# Patient Record
Sex: Male | Born: 1950
Health system: Southern US, Community
[De-identification: ages and names within clinical notes are randomized; demographics above are authoritative.]

## PROBLEM LIST (undated history)

## (undated) DIAGNOSIS — E785 Hyperlipidemia, unspecified: Secondary | ICD-10-CM

## (undated) DIAGNOSIS — E119 Type 2 diabetes mellitus without complications: Secondary | ICD-10-CM

## (undated) DIAGNOSIS — I10 Essential (primary) hypertension: Secondary | ICD-10-CM

## (undated) DIAGNOSIS — K635 Polyp of colon: Secondary | ICD-10-CM

## (undated) DIAGNOSIS — R011 Cardiac murmur, unspecified: Secondary | ICD-10-CM

## (undated) HISTORY — DX: Type 2 diabetes mellitus without complications: E11.9

## (undated) HISTORY — DX: Hyperlipidemia, unspecified: E78.5

## (undated) HISTORY — DX: Polyp of colon: K63.5

## (undated) HISTORY — DX: Cardiac murmur, unspecified: R01.1

## (undated) HISTORY — DX: Essential (primary) hypertension: I10

---

## 1990-09-24 HISTORY — PX: OTHER SURGICAL HISTORY: SHX169

## 2018-07-02 ENCOUNTER — Telehealth: Payer: Self-pay | Admitting: Family Medicine

## 2018-07-02 ENCOUNTER — Ambulatory Visit (INDEPENDENT_AMBULATORY_CARE_PROVIDER_SITE_OTHER): Payer: PPO | Admitting: Family Medicine

## 2018-07-02 ENCOUNTER — Encounter: Payer: Self-pay | Admitting: Family Medicine

## 2018-07-02 VITALS — BP 138/88 | HR 49 | Temp 98.1°F | Ht 64.0 in | Wt 154.1 lb

## 2018-07-02 DIAGNOSIS — E119 Type 2 diabetes mellitus without complications: Secondary | ICD-10-CM | POA: Diagnosis not present

## 2018-07-02 DIAGNOSIS — Z23 Encounter for immunization: Secondary | ICD-10-CM

## 2018-07-02 DIAGNOSIS — I10 Essential (primary) hypertension: Secondary | ICD-10-CM

## 2018-07-02 LAB — HEMOGLOBIN A1C: HEMOGLOBIN A1C: 6.5 % (ref 4.6–6.5)

## 2018-07-02 MED ORDER — AMLODIPINE BESYLATE-VALSARTAN 5-320 MG PO TABS: 1.0000 | ORAL_TABLET | Freq: Every day | ORAL | 5 refills | Status: DC

## 2018-07-02 MED ORDER — METOPROLOL SUCCINATE ER 100 MG PO TB24: 100.0000 mg | ORAL_TABLET | Freq: Every day | ORAL | 3 refills | Status: DC

## 2018-07-02 NOTE — Telephone Encounter (Signed)
Pt given results per notes of Dr. Carmelia Roller on 07/02/18, patient verbalized understanding. Unable to document in result note due to result note not being routed to [facility].

## 2018-07-02 NOTE — Progress Notes (Signed)
Chief Complaint  Patient presents with  . New Patient (Initial Visit)       New Patient Visit SUBJECTIVE: HPI: Scott Davila is an 67 y.o.male who is being seen for establishing care.   The patient was previously seen in [place].  Hypertension Patient presents for hypertension follow up. He does not monitor home blood pressures. He is compliant with medications- Metoprolol 200 mg/d, HCTZ 25 mg/d, Quinapril 40 mg/d. Patient has these side effects of medication: fatigue He is adhering to a good diet overall. Exercise: nothing   History of diabetes, last A1c was 6.1 around 6 months ago.  He is compliant with his metformin 500 mg twice daily.  He denies any chest pain or shortness of breath.  He is taking simvastatin.  Diet and exercise as noted above.  His last eye exam was over a year ago but with the recent move he wanted to get established down here.  Allergies  Allergen Reactions  . Penicillins     Past Medical History:  Diagnosis Date  . Colon polyps   . Diabetes mellitus without complication (HCC)   . Heart murmur   . Hyperlipidemia   . Hypertension    History reviewed. No pertinent surgical history. Family History  Problem Relation Age of Onset  . Arthritis Mother   . Hearing loss Mother   . Hyperlipidemia Mother   . Hypertension Mother   . Dementia Mother   . Dementia Father   . Muscular dystrophy Father   . COPD Sister   . Alcohol abuse Brother   . COPD Maternal Grandmother   . Hearing loss Maternal Grandmother   . Diabetes Maternal Grandfather   . Heart disease Paternal Grandfather   . Heart attack Paternal Grandfather    Allergies  Allergen Reactions  . Penicillins     Current Outpatient Medications:  .  aspirin EC 81 MG tablet, Take 81 mg by mouth daily., Disp: , Rfl:  .  B Complex Vitamins (B COMPLEX 1 PO), Take 1 tablet by mouth daily., Disp: , Rfl:  .  cholecalciferol (VITAMIN D) 400 units TABS tablet, Take 400 Units by mouth daily., Disp: , Rfl:   .  co-enzyme Q-10 30 MG capsule, Take 30 mg by mouth 3 (three) times daily., Disp: , Rfl:  .  Cobalamine Combinations (B-12) 1000-400 MCG SUBL, Place 1 tablet under the tongue daily., Disp: , Rfl:  .  hydrochlorothiazide (HYDRODIURIL) 25 MG tablet, Take 25 mg by mouth daily., Disp: , Rfl:  .  Lycopene 10 MG CAPS, Take 1 capsule by mouth daily., Disp: , Rfl:  .  Magnesium 250 MG TABS, Take 1 tablet by mouth daily., Disp: , Rfl:  .  metFORMIN (GLUCOPHAGE) 500 MG tablet, Take by mouth 2 (two) times daily with a meal., Disp: , Rfl:  .  Multiple Vitamins-Minerals (SENIOR MULTIVITAMIN PLUS PO), Take by mouth., Disp: , Rfl:  .  Omega-3 Fatty Acids (FISH OIL) 1000 MG CAPS, Take 1 capsule by mouth daily., Disp: , Rfl:  .  simvastatin (ZOCOR) 20 MG tablet, Take 20 mg by mouth daily., Disp: , Rfl:  .  vitamin C (ASCORBIC ACID) 500 MG tablet, Take 500 mg by mouth daily., Disp: , Rfl:  .  amLODipine-valsartan (EXFORGE) 5-320 MG tablet, Take 1 tablet by mouth daily., Disp: 30 tablet, Rfl: 5 .  metoprolol succinate (TOPROL-XL) 100 MG 24 hr tablet, Take 1 tablet (100 mg total) by mouth daily. Take with or immediately following a meal., Disp: 90  tablet, Rfl: 3  ROS Cardiovascular: Denies chest pain  Respiratory: Denies dyspnea   OBJECTIVE: BP 138/88 (BP Location: Left Arm, Patient Position: Sitting, Cuff Size: Normal)   Pulse (!) 49   Temp 98.1 F (36.7 C) (Oral)   Ht 5\' 4"  (1.626 m)   Wt 154 lb 2 oz (69.9 kg)   SpO2 95%   BMI 26.46 kg/m   Constitutional: -  VS reviewed -  Well developed, well nourished, appears stated age -  No apparent distress  Psychiatric: -  Oriented to person, place, and time -  Memory intact -  Affect and mood normal -  Fluent conversation, good eye contact -  Judgment and insight age appropriate  Eye: -  Conjunctivae clear, no discharge -  Pupils symmetric, round, reactive to light  ENMT: -  MMM    Pharynx moist, no exudate, no erythema  Neck: -  No gross swelling,  no palpable masses -  Thyroid midline, not enlarged, mobile, no palpable masses  Cardiovascular: -  RRR -  No LE edema  Respiratory: -  Normal respiratory effort, no accessory muscle use, no retraction -  Breath sounds equal, no wheezes, no ronchi, no crackles  Musculoskeletal: -  No clubbing, no cyanosis -  Gait normal  Skin: -  No significant lesion on inspection -  Warm and dry to palpation   ASSESSMENT/PLAN: Diabetes mellitus type 2 in nonobese (HCC) - Plan: metFORMIN (GLUCOPHAGE) 500 MG tablet, simvastatin (ZOCOR) 20 MG tablet, Hemoglobin A1c, Ambulatory referral to Ophthalmology  Essential hypertension - Plan: hydrochlorothiazide (HYDRODIURIL) 25 MG tablet, metoprolol succinate (TOPROL-XL) 100 MG 24 hr tablet, amLODipine-valsartan (EXFORGE) 5-320 MG tablet  Need for influenza vaccination - Plan: Flu vaccine HIGH DOSE PF (Fluzone High dose)  Patient instructed to sign release of records form from his previous PCP. Counseled on diet and exercise. Ck BP's at home. Patient should return in 6 weeks to reck BP. The patient voiced understanding and agreement to the plan.   [name] [place], DO 07/02/18  10:39 AM

## 2018-07-02 NOTE — Progress Notes (Signed)
Pre visit review using our clinic review tool, if applicable. No additional management support is needed unless otherwise documented below in the visit note. 

## 2018-07-02 NOTE — Telephone Encounter (Signed)
Copied from CRM [id number]. Topic: Quick Communication - Lab Results >> Jul 02, 2018  4:44 PM Ewing, Vladimir Crofts, CMA wrote: Called patient to inform them of 07/02/2018 lab results. When patient returns call, triage nurse may disclose results. >> Jul 02, 2018  4:59 PM Darletta Moll L wrote: No [facility] nurse available for lab results. Please call patient.

## 2018-07-02 NOTE — Patient Instructions (Signed)
Around 3 times per week, check your blood pressure 4 times per day. Twice in the morning and twice in the evening. The readings should be at least one minute apart. Write down these values and bring them to your next nurse visit/appointment.  When you check your BP, make sure you have been doing something calm/relaxing 5 minutes prior to checking. Both feet should be flat on the floor and you should be sitting. Use your left arm and make sure it is in a relaxed position (on a table), and that the cuff is at the approximate level/height of your heart.   Keep the diet clean and stay active.  Give [place] 2-3 business days to get the results of your labs back.   If you do not hear anything about your referral in the next 1-2 weeks, call our office and ask for an update.  Let us know if you need anything.

## 2018-07-23 DIAGNOSIS — E119 Type 2 diabetes mellitus without complications: Secondary | ICD-10-CM | POA: Diagnosis not present

## 2018-07-23 DIAGNOSIS — H40023 Open angle with borderline findings, high risk, bilateral: Secondary | ICD-10-CM | POA: Diagnosis not present

## 2018-07-23 LAB — HM DIABETES EYE EXAM

## 2018-07-25 ENCOUNTER — Encounter: Payer: Self-pay | Admitting: Family Medicine

## 2018-07-25 ENCOUNTER — Telehealth: Payer: Self-pay | Admitting: *Deleted

## 2018-07-25 NOTE — Telephone Encounter (Signed)
Received Diabetic Eye Exam Report from [facility]; forwarded to provider/SLS 11/01

## 2018-07-28 ENCOUNTER — Telehealth: Payer: Self-pay | Admitting: *Deleted

## 2018-07-28 NOTE — Telephone Encounter (Signed)
Received Medical records from [facility]; forwarded to provider/SLS 11/04

## 2018-07-30 ENCOUNTER — Telehealth: Payer: Self-pay | Admitting: Family Medicine

## 2018-07-30 NOTE — Telephone Encounter (Signed)
Pt due for colonoscopy in 12/19/19. Has never had AAA, Hep C screen or Shingrix?

## 2018-08-13 ENCOUNTER — Encounter: Payer: Self-pay | Admitting: Family Medicine

## 2018-08-13 ENCOUNTER — Ambulatory Visit (INDEPENDENT_AMBULATORY_CARE_PROVIDER_SITE_OTHER): Payer: PPO | Admitting: Family Medicine

## 2018-08-13 VITALS — BP 120/84 | HR 69 | Temp 97.4°F | Ht 63.5 in | Wt 161.0 lb

## 2018-08-13 DIAGNOSIS — E663 Overweight: Secondary | ICD-10-CM

## 2018-08-13 DIAGNOSIS — I10 Essential (primary) hypertension: Secondary | ICD-10-CM | POA: Diagnosis not present

## 2018-08-13 DIAGNOSIS — E119 Type 2 diabetes mellitus without complications: Secondary | ICD-10-CM | POA: Diagnosis not present

## 2018-08-13 MED ORDER — METOPROLOL SUCCINATE ER 50 MG PO TB24: 50.0000 mg | ORAL_TABLET | Freq: Every day | ORAL | 1 refills | Status: DC

## 2018-08-13 MED ORDER — SIMVASTATIN 20 MG PO TABS: 20.0000 mg | ORAL_TABLET | Freq: Every day | ORAL | 2 refills | Status: DC

## 2018-08-13 MED ORDER — HYDROCHLOROTHIAZIDE 25 MG PO TABS: 25.0000 mg | ORAL_TABLET | Freq: Every day | ORAL | 2 refills | Status: DC

## 2018-08-13 NOTE — Progress Notes (Signed)
Chief Complaint  Patient presents with  . Follow-up    blood pressure    Subjective Scott HartshornWilfred Davila is a 67 y.o. male who presents for hypertension follow up. He does monitor home blood pressures. Blood pressures ranging from 110-130's/70's on average. He is compliant with medications. Patient has these side effects of medication: none He is adhering to a healthy diet overall. Current exercise: none   Past Medical History:  Diagnosis Date  . Colon polyps   . Diabetes mellitus without complication (HCC)   . Heart murmur   . Hyperlipidemia   . Hypertension     Review of Systems Cardiovascular: no chest pain Respiratory:  no shortness of breath  Exam BP 120/84 (BP Location: Left Arm, Patient Position: Sitting, Cuff Size: Normal)   Pulse 69   Temp (!) 97.4 F (36.3 C) (Oral)   Ht 5' 3.5" (1.613 m)   Wt 161 lb (73 kg)   SpO2 95%   BMI 28.07 kg/m  General:  well developed, well nourished, in no apparent distress Heart: RRR, no bruits, no LE edema Lungs: clear to auscultation, no accessory muscle use Psych: well oriented with normal range of affect and appropriate judgment/insight  Essential hypertension - Plan: hydrochlorothiazide (HYDRODIURIL) 25 MG tablet  Overweight  Diabetes mellitus type 2 in nonobese (HCC) - Plan: simvastatin (ZOCOR) 20 MG tablet  Cont BP meds. Ck at home.  Counseled on diet and exercise. Goal wt for 5 mo f/u is 150 lbs. F/u in 5 mo or prn. The patient voiced understanding and agreement to the plan.  [name], DO 08/13/18  2:24 PM

## 2018-08-13 NOTE — Patient Instructions (Addendum)
Ok to stop checking BP so often. Consider a new cuff. When checking, OK to do once per day. Write down levels.  Keep the diet clean and stay active.  Aim to do some physical exertion for 150 minutes per week. This is typically divided into 5 days per week, 30 minutes per day. The activity should be enough to get your heart rate up. Anything is better than nothing if you have time constraints.  Goal weight: 150 lbs by April.  Let us know if you need anything.

## 2018-08-13 NOTE — Progress Notes (Signed)
Pre visit review using our clinic review tool, if applicable. No additional management support is needed unless otherwise documented below in the visit note. 

## 2018-09-03 DIAGNOSIS — H40023 Open angle with borderline findings, high risk, bilateral: Secondary | ICD-10-CM | POA: Diagnosis not present

## 2018-09-03 DIAGNOSIS — E119 Type 2 diabetes mellitus without complications: Secondary | ICD-10-CM | POA: Diagnosis not present

## 2018-10-06 ENCOUNTER — Other Ambulatory Visit: Payer: Self-pay

## 2018-10-06 ENCOUNTER — Emergency Department (HOSPITAL_BASED_OUTPATIENT_CLINIC_OR_DEPARTMENT_OTHER): Payer: PPO

## 2018-10-06 ENCOUNTER — Encounter (HOSPITAL_BASED_OUTPATIENT_CLINIC_OR_DEPARTMENT_OTHER): Payer: Self-pay | Admitting: Emergency Medicine

## 2018-10-06 ENCOUNTER — Emergency Department (HOSPITAL_BASED_OUTPATIENT_CLINIC_OR_DEPARTMENT_OTHER)
Admission: EM | Admit: 2018-10-06 | Discharge: 2018-10-06 | Disposition: A | Discharge status: Home / Self Care | Payer: PPO | Attending: Emergency Medicine | Admitting: Emergency Medicine

## 2018-10-06 DIAGNOSIS — Z87891 Personal history of nicotine dependence: Secondary | ICD-10-CM | POA: Insufficient documentation

## 2018-10-06 DIAGNOSIS — Z7982 Long term (current) use of aspirin: Secondary | ICD-10-CM | POA: Insufficient documentation

## 2018-10-06 DIAGNOSIS — I10 Essential (primary) hypertension: Secondary | ICD-10-CM | POA: Insufficient documentation

## 2018-10-06 DIAGNOSIS — Z794 Long term (current) use of insulin: Secondary | ICD-10-CM | POA: Insufficient documentation

## 2018-10-06 DIAGNOSIS — E119 Type 2 diabetes mellitus without complications: Secondary | ICD-10-CM | POA: Diagnosis not present

## 2018-10-06 DIAGNOSIS — Z79899 Other long term (current) drug therapy: Secondary | ICD-10-CM | POA: Insufficient documentation

## 2018-10-06 DIAGNOSIS — R0789 Other chest pain: Secondary | ICD-10-CM

## 2018-10-06 DIAGNOSIS — R079 Chest pain, unspecified: Secondary | ICD-10-CM | POA: Diagnosis not present

## 2018-10-06 LAB — CBC WITH DIFFERENTIAL/PLATELET
Abs Immature Granulocytes: 0.02 10*3/uL (ref 0.00–0.07)
Basophils Absolute: 0.1 10*3/uL (ref 0.0–0.1)
Basophils Relative: 1 %
Eosinophils Absolute: 0.1 10*3/uL (ref 0.0–0.5)
Eosinophils Relative: 2 %
HCT: 46.9 % (ref 39.0–52.0)
Hemoglobin: 15.5 g/dL (ref 13.0–17.0)
Immature Granulocytes: 0 %
LYMPHS PCT: 28 %
Lymphs Abs: 1.8 10*3/uL (ref 0.7–4.0)
MCH: 29.7 pg (ref 26.0–34.0)
MCHC: 33 g/dL (ref 30.0–36.0)
MCV: 89.8 fL (ref 80.0–100.0)
Monocytes Absolute: 0.7 10*3/uL (ref 0.1–1.0)
Monocytes Relative: 11 %
NEUTROS ABS: 3.7 10*3/uL (ref 1.7–7.7)
Neutrophils Relative %: 58 %
Platelets: 187 10*3/uL (ref 150–400)
RBC: 5.22 MIL/uL (ref 4.22–5.81)
RDW: 12.3 % (ref 11.5–15.5)
WBC: 6.3 10*3/uL (ref 4.0–10.5)
nRBC: 0 % (ref 0.0–0.2)

## 2018-10-06 LAB — COMPREHENSIVE METABOLIC PANEL
ALT: 54 U/L — ABNORMAL HIGH (ref 0–44)
AST: 31 U/L (ref 15–41)
Albumin: 4.5 g/dL (ref 3.5–5.0)
Alkaline Phosphatase: 52 U/L (ref 38–126)
Anion gap: 8 (ref 5–15)
BUN: 18 mg/dL (ref 8–23)
CO2: 25 mmol/L (ref 22–32)
Calcium: 9.6 mg/dL (ref 8.9–10.3)
Chloride: 103 mmol/L (ref 98–111)
Creatinine, Ser: 0.88 mg/dL (ref 0.61–1.24)
GFR calc Af Amer: >60 mL/min (ref >60)
GFR calc non Af Amer: >60 mL/min (ref >60)
Glucose, Bld: 187 mg/dL — ABNORMAL HIGH (ref 70–99)
Potassium: 3.8 mmol/L (ref 3.5–5.1)
SODIUM: 136 mmol/L (ref 135–145)
Total Bilirubin: 0.9 mg/dL (ref 0.3–1.2)
Total Protein: 7.3 g/dL (ref 6.5–8.1)

## 2018-10-06 LAB — LIPASE, BLOOD: Lipase: 36 U/L (ref 11–51)

## 2018-10-06 LAB — TROPONIN I
Troponin I: <0.03 ng/mL (ref <0.03)
Troponin I: <0.03 ng/mL (ref <0.03)

## 2018-10-06 NOTE — Discharge Instructions (Signed)
Take pepcid as needed for chest pain. You likely have some reflux.   See your doctor.  Return to ER if you have worse chest pain, trouble breathing.

## 2018-10-06 NOTE — ED Notes (Signed)
Pt given water 

## 2018-10-06 NOTE — ED Provider Notes (Signed)
[facility] EMERGENCY DEPARTMENT Provider Note   CSN: [id number] Arrival date & time: 10/06/18  1111     History   Chief Complaint Chief Complaint  Patient presents with  . Chest Pain    HPI Scott Davila is a 68 y.o. male history of diabetes, heart murmur, hyperlipidemia, hypertension here presenting with right-sided chest pain.  Patient states that he has intermittent right-sided chest pain for the last several days.  States that it is intermittent and there is no certain pattern.  Patient states that it is not exertional and is not worse with eating.  He was driving today and the pain got slightly worse so he came in for evaluation.  He was noted to be hypertensive in triage but took his blood pressure medicine this morning.  He has no known coronary artery disease.  Denies any recent travel.   The history is provided by the patient.    Past Medical History:  Diagnosis Date  . Colon polyps   . Diabetes mellitus without complication (HCC)   . Heart murmur   . Hyperlipidemia   . Hypertension     Patient Active Problem List   Diagnosis Date Noted  . Essential hypertension 08/13/2018  . Diabetes mellitus type 2 in nonobese (HCC) 07/02/2018    History reviewed. No pertinent surgical history.      Home Medications    Prior to Admission medications   Medication Sig Start Date End Date Taking? Authorizing Provider  amLODipine-valsartan (EXFORGE) 5-320 MG tablet Take 1 tablet by mouth daily. 07/02/18   Sharlene Dory, DO  aspirin EC 81 MG tablet Take 81 mg by mouth daily.    [provider]  B Complex Vitamins (B COMPLEX 1 PO) Take 1 tablet by mouth daily.    [provider]  cholecalciferol (VITAMIN D) 400 units TABS tablet Take 400 Units by mouth daily.    [provider]  co-enzyme Q-10 30 MG capsule Take 30 mg by mouth 3 (three) times daily.    [provider]  Cobalamine Combinations (B-12) 1000-400 MCG SUBL  Place 1 tablet under the tongue daily.    [provider]  hydrochlorothiazide (HYDRODIURIL) 25 MG tablet Take 1 tablet (25 mg total) by mouth daily. 08/13/18   Sharlene Dory, DO  Lycopene 10 MG CAPS Take 1 capsule by mouth daily.    [provider]  Magnesium 250 MG TABS Take 1 tablet by mouth daily.    [provider]  metFORMIN (GLUCOPHAGE) 500 MG tablet Take by mouth 2 (two) times daily with a meal.    [provider]  metoprolol succinate (TOPROL-XL) 50 MG 24 hr tablet Take 1 tablet (50 mg total) by mouth daily. Take with or immediately following a meal. 08/13/18   Wendling, [name], DO  Multiple Vitamins-Minerals (SENIOR MULTIVITAMIN PLUS PO) Take by mouth.    [provider]  Omega-3 Fatty Acids (FISH OIL) 1000 MG CAPS Take 1 capsule by mouth daily.    [provider]  simvastatin (ZOCOR) 20 MG tablet Take 1 tablet (20 mg total) by mouth daily. 08/13/18   Sharlene Dory, DO  vitamin C (ASCORBIC ACID) 500 MG tablet Take 500 mg by mouth daily.    [provider]    Family History Family History  Problem Relation Age of Onset  . Arthritis Mother   . Hearing loss Mother   . Hyperlipidemia Mother   . Hypertension Mother   . Dementia Mother   .  Dementia Father   . Muscular dystrophy Father   . COPD Sister   . Alcohol abuse Brother   . COPD Maternal Grandmother   . Hearing loss Maternal Grandmother   . Diabetes Maternal Grandfather   . Heart disease Paternal Grandfather   . Heart attack Paternal Grandfather     Social History Social History   Tobacco Use  . Smoking status: Former Games developermoker  . Smokeless tobacco: Never Used  Substance Use Topics  . Alcohol use: Never    Frequency: Never  . Drug use: Never     Allergies   Penicillins   Review of Systems Review of Systems  Cardiovascular: Positive for chest pain.  All other systems reviewed and are negative.    Physical  Exam Updated Vital Signs BP (!) 128/92 (BP Location: Left Arm)   Pulse 70   Temp 97.9 F (36.6 C) (Oral)   Resp 15   Ht 5' 3.5" (1.613 m)   Wt 73.5 kg   SpO2 95%   BMI 28.25 kg/m   Physical Exam Vitals signs and nursing note reviewed.  Constitutional:      Appearance: He is well-developed.  HENT:     Head: Normocephalic.  Eyes:     Extraocular Movements: Extraocular movements intact.     Pupils: Pupils are equal, round, and reactive to light.  Neck:     Musculoskeletal: Normal range of motion and neck supple.  Cardiovascular:     Rate and Rhythm: Normal rate and regular rhythm.     Heart sounds: Normal heart sounds.  Pulmonary:     Effort: Pulmonary effort is normal.     Breath sounds: Normal breath sounds.  Abdominal:     General: Bowel sounds are normal.     Palpations: Abdomen is soft.  Musculoskeletal: Normal range of motion.  Skin:    General: Skin is warm.     Capillary Refill: Capillary refill takes less than 2 seconds.  Neurological:     General: No focal deficit present.     Mental Status: He is alert and oriented to person, place, and time.  Psychiatric:        Mood and Affect: Mood normal.        Behavior: Behavior normal.      ED Treatments / Results  Labs (all labs ordered are listed, but only abnormal results are displayed) Labs Reviewed  COMPREHENSIVE METABOLIC PANEL - Abnormal; Notable for the following components:      Result Value   Glucose, Bld 187 (*)    ALT 54 (*)    All other components within normal limits  CBC WITH DIFFERENTIAL/PLATELET  TROPONIN I  LIPASE, BLOOD  TROPONIN I    EKG EKG Interpretation  Date/Time:  Monday October 06 2018 11:22:04 EST Ventricular Rate:  73 PR Interval:  182 QRS Duration: 84 QT Interval:  420 QTC Calculation: 462 R Axis:   6 Text Interpretation:  Normal sinus rhythm Septal infarct , age undetermined Abnormal ECG No previous ECGs available Confirmed by Richardean CanalYao, Wilian Kwong H ([id number]) on 10/06/2018  11:49:59 AM   Radiology Dg Chest 2 View  Result Date: 10/06/2018 CLINICAL DATA:  Chest pain. EXAM: CHEST - 2 VIEW COMPARISON:  None. FINDINGS: Heart size and pulmonary vascularity are normal. Lungs are clear. Aortic atherosclerosis. No acute bone abnormality. Old fracture of the posterolateral aspect of the left ninth rib. IMPRESSION: No active cardiopulmonary disease. Aortic Atherosclerosis (ICD10-I70.0). Electronically Signed   By: Francene BoyersJames  Maxwell M.D.   On:  10/06/2018 12:06    Procedures Procedures (including critical care time)  Medications Ordered in ED Medications - No data to display   Initial Impression / Assessment and Plan / ED Course  I have reviewed the triage vital signs and the nursing notes.  Pertinent labs & imaging results that were available during my care of the patient were reviewed by me and considered in my medical decision making (see chart for details).    Scott Davila is a 68 y.o. male here with chest pain. R sided chest pain for several days. No previous hx of CAD. Patient hypertensive but not tachycardic. I doubt PE and have low suspicion for dissection. Will get labs, trop x 2, CXR. Pain free currently.   3:04 PM CXR clear. BP improved to 128/92 with no treatment. Felt better now. Delta trop pending. Anticipated dc home with cardiology follow up with delta trop neg. Signed out to Dr. Rubin PayorPickering in the ED.    Final Clinical Impressions(s) / ED Diagnoses   Final diagnoses:  Other chest pain    ED Discharge Orders    None       Charlynne PanderYao, Chrishana Spargur Hsienta, MD 10/06/18 1505

## 2018-10-06 NOTE — ED Provider Notes (Signed)
  Physical Exam  BP (!) 128/92 (BP Location: Left Arm)   Pulse 70   Temp 97.9 F (36.6 C) (Oral)   Resp 15   Ht 5' 3.5" (1.613 m)   Wt 73.5 kg   SpO2 95%   BMI 28.25 kg/m   Physical Exam  ED Course/Procedures     Procedures  MDM  Second troponin negative.  Will discharge home.       Benjiman Core, MD 10/06/18 [phone]

## 2018-10-06 NOTE — ED Triage Notes (Signed)
Reports acute onset of right sided chest pain that occurred while driving today.  C/o shortness of breath.  Describes the pain as sharp.

## 2018-12-19 ENCOUNTER — Encounter: Payer: Self-pay | Admitting: Family Medicine

## 2018-12-19 ENCOUNTER — Other Ambulatory Visit: Payer: Self-pay | Admitting: Family Medicine

## 2018-12-19 DIAGNOSIS — E119 Type 2 diabetes mellitus without complications: Secondary | ICD-10-CM

## 2018-12-19 DIAGNOSIS — I10 Essential (primary) hypertension: Secondary | ICD-10-CM

## 2018-12-19 MED ORDER — METFORMIN HCL 500 MG PO TABS: 500.0000 mg | ORAL_TABLET | Freq: Two times a day (BID) | ORAL | 3 refills | Status: DC

## 2018-12-23 ENCOUNTER — Encounter: Payer: Self-pay | Admitting: Family Medicine

## 2018-12-23 MED ORDER — METFORMIN HCL ER 500 MG PO TB24: 1000.0000 mg | ORAL_TABLET | Freq: Every day | ORAL | 5 refills | Status: DC

## 2018-12-24 ENCOUNTER — Other Ambulatory Visit: Payer: Self-pay | Admitting: Family Medicine

## 2018-12-31 ENCOUNTER — Encounter: Payer: PPO | Admitting: Family Medicine

## 2019-01-21 ENCOUNTER — Other Ambulatory Visit: Payer: Self-pay | Admitting: Family Medicine

## 2019-03-06 ENCOUNTER — Encounter: Payer: Self-pay | Admitting: Family Medicine

## 2019-03-06 ENCOUNTER — Ambulatory Visit (INDEPENDENT_AMBULATORY_CARE_PROVIDER_SITE_OTHER): Payer: PPO | Admitting: Family Medicine

## 2019-03-06 ENCOUNTER — Other Ambulatory Visit: Payer: Self-pay

## 2019-03-06 VITALS — BP 118/78 | HR 67 | Temp 98.2°F | Ht 64.0 in | Wt 160.0 lb

## 2019-03-06 DIAGNOSIS — E119 Type 2 diabetes mellitus without complications: Secondary | ICD-10-CM

## 2019-03-06 DIAGNOSIS — Z136 Encounter for screening for cardiovascular disorders: Secondary | ICD-10-CM | POA: Diagnosis not present

## 2019-03-06 DIAGNOSIS — Z1159 Encounter for screening for other viral diseases: Secondary | ICD-10-CM

## 2019-03-06 DIAGNOSIS — Z0000 Encounter for general adult medical examination without abnormal findings: Secondary | ICD-10-CM | POA: Diagnosis not present

## 2019-03-06 LAB — MICROALBUMIN / CREATININE URINE RATIO
Creatinine,U: 30.2 mg/dL
Microalb Creat Ratio: 2.3 mg/g (ref 0.0–30.0)
Microalb, Ur: <0.7 mg/dL (ref 0.0–1.9)

## 2019-03-06 LAB — COMPREHENSIVE METABOLIC PANEL
ALT: 50 U/L (ref 0–53)
AST: 28 U/L (ref 0–37)
Albumin: 5.1 g/dL (ref 3.5–5.2)
Alkaline Phosphatase: 52 U/L (ref 39–117)
BUN: 17 mg/dL (ref 6–23)
CO2: 33 mEq/L — ABNORMAL HIGH (ref 19–32)
Calcium: 10.5 mg/dL (ref 8.4–10.5)
Chloride: 97 mEq/L (ref 96–112)
Creatinine, Ser: 1.17 mg/dL (ref 0.40–1.50)
GFR: 62.02 mL/min (ref >60.00)
Glucose, Bld: 98 mg/dL (ref 70–99)
Potassium: 4.3 mEq/L (ref 3.5–5.1)
Sodium: 141 mEq/L (ref 135–145)
Total Bilirubin: 1.1 mg/dL (ref 0.2–1.2)
Total Protein: 7.2 g/dL (ref 6.0–8.3)

## 2019-03-06 LAB — CBC
HCT: 46.8 % (ref 39.0–52.0)
Hemoglobin: 15.8 g/dL (ref 13.0–17.0)
MCHC: 33.7 g/dL (ref 30.0–36.0)
MCV: 89.8 fl (ref 78.0–100.0)
Platelets: 185 10*3/uL (ref 150.0–400.0)
RBC: 5.21 Mil/uL (ref 4.22–5.81)
RDW: 13.1 % (ref 11.5–15.5)
WBC: 7.1 10*3/uL (ref 4.0–10.5)

## 2019-03-06 LAB — LIPID PANEL
Cholesterol: 137 mg/dL (ref 0–200)
HDL: 40.8 mg/dL (ref >39.00)
NonHDL: 96.43
Total CHOL/HDL Ratio: 3
Triglycerides: 348 mg/dL — ABNORMAL HIGH (ref 0.0–149.0)
VLDL: 69.6 mg/dL — ABNORMAL HIGH (ref 0.0–40.0)

## 2019-03-06 LAB — LDL CHOLESTEROL, DIRECT: Direct LDL: 69 mg/dL

## 2019-03-06 LAB — HEMOGLOBIN A1C: Hgb A1c MFr Bld: 6.9 % — ABNORMAL HIGH (ref 4.6–6.5)

## 2019-03-06 NOTE — Progress Notes (Signed)
Chief Complaint  Patient presents with  . Annual Exam    Well Male Scott Davila is here for a complete physical.   His last physical was >1 year ago.  Current diet: in general, a "healthy" diet.   Current exercise: walking, Zumba Weight trend: stable Daytime fatigue? No. Seat belt? Yes.    Health maintenance Colonoscopy- Yes Tetanus- Yes Hep C- No  Pneumonia vaccine- Due for final one in 12/15/2020 AAA screening- No  Past Medical History:  Diagnosis Date  . Colon polyps   . Diabetes mellitus without complication (HCC)   . Heart murmur   . Hyperlipidemia   . Hypertension      Past Surgical History:  Procedure Laterality Date  . OTHER SURGICAL HISTORY  1992   Maxillary surgery after accident; rod placement in femur    Medications  Current Outpatient Medications on File Prior to Visit  Medication Sig Dispense Refill  . amLODipine-valsartan (EXFORGE) 5-320 MG tablet TAKE 1 TABLET BY MOUTH EVERY DAY 90 tablet 1  . aspirin EC 81 MG tablet Take 81 mg by mouth daily.    . B Complex Vitamins (B COMPLEX 1 PO) Take 1 tablet by mouth daily.    . cholecalciferol (VITAMIN D) 400 units TABS tablet Take 400 Units by mouth daily.    [name]. co-enzyme Q-10 30 MG capsule Take 30 mg by mouth 3 (three) times daily.    . Cobalamine Combinations (B-12) 1000-400 MCG SUBL Place 1 tablet under the tongue daily.    . hydrochlorothiazide (HYDRODIURIL) 25 MG tablet Take 1 tablet (25 mg total) by mouth daily. 90 tablet 2  . Lycopene 10 MG CAPS Take 1 capsule by mouth daily.    . Magnesium 250 MG TABS Take 1 tablet by mouth daily.    . metFORMIN (GLUCOPHAGE-XR) 500 MG 24 hr tablet Take 1 tablet (500 mg total) by mouth daily with breakfast. 30 tablet 5  . metoprolol succinate (TOPROL-XL) 50 MG 24 hr tablet TAKE 1 TABLET (50 MG TOTAL) BY MOUTH DAILY. TAKE WITH OR IMMEDIATELY FOLLOWING A MEAL. 90 tablet 1  . Multiple Vitamins-Minerals (SENIOR MULTIVITAMIN PLUS PO) Take by mouth.    . Omega-3 Fatty Acids  (FISH OIL) 1000 MG CAPS Take 1 capsule by mouth daily.    . simvastatin (ZOCOR) 20 MG tablet Take 1 tablet (20 mg total) by mouth daily. 90 tablet 2  . vitamin C (ASCORBIC ACID) 500 MG tablet Take 500 mg by mouth daily.     Allergies Allergies  Allergen Reactions  . Penicillins     Family History Family History  Problem Relation Age of Onset  . Arthritis Mother   . Hearing loss Mother   . Hyperlipidemia Mother   . Hypertension Mother   . Dementia Mother   . Dementia Father   . Muscular dystrophy Father   . COPD Sister   . Alcohol abuse Brother   . COPD Maternal Grandmother   . Hearing loss Maternal Grandmother   . Diabetes Maternal Grandfather   . Heart disease Paternal Grandfather   . Heart attack Paternal Grandfather     Review of Systems: Constitutional:  no fevers or chills Eye:  no recent significant change in vision Ear/Nose/Mouth/Throat:  Ears:  no recent hearing loss Nose/Mouth/Throat:  no complaints of nasal congestion or sore throat Cardiovascular:  no chest pain, no palpitations Respiratory:  no cough and no shortness of breath Gastrointestinal:  no abdominal pain, no change in bowel habits GU:  Male: negative for  dysuria, frequency, and incontinence and negative for prostate symptoms Musculoskeletal/Extremities:  no pain, redness, or swelling of the joints Integumentary (Skin):  no abnormal skin lesions reported Neurologic:  no headaches, Endocrine:  No unexpected weight changes Hematologic/Lymphatic:  no areas of easy bruising  Exam BP 118/78 (BP Location: Left Arm, Patient Position: Sitting, Cuff Size: Normal)   Pulse 67   Temp 98.2 F (36.8 C) (Oral)   Ht 5\' 4"  (1.626 m)   Wt 160 lb (72.6 kg)   SpO2 97%   BMI 27.46 kg/m  General:  well developed, well nourished, in no apparent distress Skin:  no significant moles, warts, or growths Head:  no masses, lesions, or tenderness Eyes:  pupils equal and round, sclera anicteric without injection Ears:   canals without lesions, +cerumen 80% b/l, TMs shiny without retraction, no obvious effusion, no erythema Nose:  nares patent, septum midline, mucosa normal Throat/Pharynx:  lips and gingiva without lesion; tongue and uvula midline; non-inflamed pharynx; no exudates or postnasal drainage Neck: neck supple without adenopathy, thyromegaly, or masses Lungs:  clear to auscultation, breath sounds equal bilaterally, no respiratory distress Cardio:  regular rate and rhythm, no LE edema or bruits Abdomen:  abdomen soft, nontender; bowel sounds normal; no masses or organomegaly Rectal: Deferred Musculoskeletal:  symmetrical muscle groups noted without atrophy or deformity Extremities:  no clubbing, cyanosis, or edema, no deformities, no skin discoloration Neuro:  gait normal; deep tendon reflexes normal and symmetric Psych: well oriented with normal range of affect and appropriate judgment/insight  Assessment and Plan  Well adult exam - Plan: Lipid panel, Comprehensive metabolic panel, CBC  Diabetes mellitus type 2 in nonobese (HCC) - Plan: Hemoglobin A1c, Microalbumin / creatinine urine ratio, HM DIABETES FOOT EXAM, doing well  Encounter for hepatitis C screening test for low risk patient - Plan: Hepatitis C antibody  Screening for AAA (abdominal aortic aneurysm) - Plan: US AORTA   Well 68 y.o. male. Counseled on diet and exercise. Other orders as above. Follow up in 6 mo or prn.  The patient voiced understanding and agreement to the plan.  [place], DO 03/06/19 10:54 AM

## 2019-03-06 NOTE — Patient Instructions (Signed)
Give us 2-3 business days to get the results of your labs back.   Keep the diet clean and stay active.  Let us know if you need anything. 

## 2019-03-09 LAB — HEPATITIS C ANTIBODY
Hepatitis C Ab: NONREACTIVE
SIGNAL TO CUT-OFF: 0.01 (ref <1.00)

## 2019-03-10 ENCOUNTER — Other Ambulatory Visit: Payer: Self-pay | Admitting: Family Medicine

## 2019-03-10 DIAGNOSIS — Z136 Encounter for screening for cardiovascular disorders: Secondary | ICD-10-CM

## 2019-03-10 DIAGNOSIS — E781 Pure hyperglyceridemia: Secondary | ICD-10-CM

## 2019-03-10 MED ORDER — SIMVASTATIN 40 MG PO TABS: 40.0000 mg | ORAL_TABLET | Freq: Every day | ORAL | 3 refills | Status: DC

## 2019-04-01 ENCOUNTER — Ambulatory Visit (HOSPITAL_BASED_OUTPATIENT_CLINIC_OR_DEPARTMENT_OTHER)
Admission: RE | Admit: 2019-04-01 | Discharge: 2019-04-01 | Disposition: A | Discharge status: Home / Self Care | Payer: PPO | Source: Ambulatory Visit | Attending: Family Medicine | Admitting: Family Medicine

## 2019-04-01 ENCOUNTER — Other Ambulatory Visit: Payer: Self-pay

## 2019-04-01 ENCOUNTER — Other Ambulatory Visit (HOSPITAL_BASED_OUTPATIENT_CLINIC_OR_DEPARTMENT_OTHER): Payer: Self-pay | Admitting: *Deleted

## 2019-04-01 ENCOUNTER — Encounter (HOSPITAL_BASED_OUTPATIENT_CLINIC_OR_DEPARTMENT_OTHER): Payer: Self-pay | Admitting: Radiology

## 2019-04-01 DIAGNOSIS — Z87891 Personal history of nicotine dependence: Secondary | ICD-10-CM | POA: Diagnosis not present

## 2019-04-01 DIAGNOSIS — Z136 Encounter for screening for cardiovascular disorders: Secondary | ICD-10-CM | POA: Diagnosis not present

## 2019-04-18 ENCOUNTER — Other Ambulatory Visit: Payer: Self-pay | Admitting: Family Medicine

## 2019-04-18 DIAGNOSIS — I10 Essential (primary) hypertension: Secondary | ICD-10-CM

## 2019-04-21 ENCOUNTER — Encounter: Payer: Self-pay | Admitting: Family Medicine

## 2019-04-21 ENCOUNTER — Other Ambulatory Visit: Payer: PPO

## 2019-04-21 ENCOUNTER — Other Ambulatory Visit (INDEPENDENT_AMBULATORY_CARE_PROVIDER_SITE_OTHER): Payer: PPO

## 2019-04-21 ENCOUNTER — Other Ambulatory Visit: Payer: Self-pay | Admitting: Family Medicine

## 2019-04-21 ENCOUNTER — Other Ambulatory Visit: Payer: Self-pay

## 2019-04-21 DIAGNOSIS — E781 Pure hyperglyceridemia: Secondary | ICD-10-CM | POA: Diagnosis not present

## 2019-04-21 LAB — LIPID PANEL
Cholesterol: 116 mg/dL (ref 0–200)
HDL: 43.2 mg/dL (ref >39.00)
NonHDL: 72.68
Total CHOL/HDL Ratio: 3
Triglycerides: 227 mg/dL — ABNORMAL HIGH (ref 0.0–149.0)
VLDL: 45.4 mg/dL — ABNORMAL HIGH (ref 0.0–40.0)

## 2019-04-21 LAB — LDL CHOLESTEROL, DIRECT: Direct LDL: 52 mg/dL

## 2019-04-21 MED ORDER — FENOFIBRATE 50 MG PO CAPS: 1.0000 | ORAL_CAPSULE | Freq: Every day | ORAL | 5 refills | Status: DC

## 2019-04-22 ENCOUNTER — Other Ambulatory Visit: Payer: Self-pay | Admitting: Family Medicine

## 2019-04-23 ENCOUNTER — Encounter: Payer: Self-pay | Admitting: Family Medicine

## 2019-04-23 NOTE — Telephone Encounter (Signed)
Pharmacy cannot order 50mg  of fenofibrate and would like to change to 48mg ?

## 2019-04-24 NOTE — Telephone Encounter (Signed)
OK 

## 2019-05-04 ENCOUNTER — Other Ambulatory Visit: Payer: Self-pay | Admitting: Family Medicine

## 2019-05-04 DIAGNOSIS — I10 Essential (primary) hypertension: Secondary | ICD-10-CM

## 2019-06-10 ENCOUNTER — Other Ambulatory Visit: Payer: Self-pay | Admitting: Family Medicine

## 2019-06-10 ENCOUNTER — Other Ambulatory Visit (INDEPENDENT_AMBULATORY_CARE_PROVIDER_SITE_OTHER): Payer: PPO

## 2019-06-10 ENCOUNTER — Other Ambulatory Visit: Payer: Self-pay

## 2019-06-10 DIAGNOSIS — E781 Pure hyperglyceridemia: Secondary | ICD-10-CM

## 2019-06-10 LAB — HEPATIC FUNCTION PANEL
ALT: 49 U/L (ref 0–53)
AST: 26 U/L (ref 0–37)
Albumin: 4.9 g/dL (ref 3.5–5.2)
Alkaline Phosphatase: 48 U/L (ref 39–117)
Bilirubin, Direct: 0.2 mg/dL (ref 0.0–0.3)
Total Bilirubin: 1.1 mg/dL (ref 0.2–1.2)
Total Protein: 7 g/dL (ref 6.0–8.3)

## 2019-06-10 LAB — LIPID PANEL
Cholesterol: 125 mg/dL (ref 0–200)
HDL: 37.5 mg/dL — ABNORMAL LOW (ref >39.00)
NonHDL: 87.36
Total CHOL/HDL Ratio: 3
Triglycerides: 218 mg/dL — ABNORMAL HIGH (ref 0.0–149.0)
VLDL: 43.6 mg/dL — ABNORMAL HIGH (ref 0.0–40.0)

## 2019-06-10 LAB — LDL CHOLESTEROL, DIRECT: Direct LDL: 67 mg/dL

## 2019-06-11 ENCOUNTER — Other Ambulatory Visit: Payer: Self-pay | Admitting: Family Medicine

## 2019-06-11 ENCOUNTER — Other Ambulatory Visit: Payer: Self-pay

## 2019-06-11 DIAGNOSIS — E781 Pure hyperglyceridemia: Secondary | ICD-10-CM

## 2019-06-11 MED ORDER — FENOFIBRATE 48 MG PO TABS: 48.0000 mg | ORAL_TABLET | Freq: Every day | ORAL | 3 refills | Status: DC

## 2019-06-16 ENCOUNTER — Other Ambulatory Visit: Payer: Self-pay | Admitting: Family Medicine

## 2019-06-19 ENCOUNTER — Other Ambulatory Visit: Payer: Self-pay | Admitting: Family Medicine

## 2019-06-27 ENCOUNTER — Other Ambulatory Visit: Payer: Self-pay | Admitting: Family Medicine

## 2019-07-20 ENCOUNTER — Other Ambulatory Visit: Payer: Self-pay | Admitting: Family Medicine

## 2019-07-23 ENCOUNTER — Other Ambulatory Visit: Payer: Self-pay

## 2019-07-23 ENCOUNTER — Other Ambulatory Visit (INDEPENDENT_AMBULATORY_CARE_PROVIDER_SITE_OTHER): Payer: PPO

## 2019-07-23 DIAGNOSIS — E781 Pure hyperglyceridemia: Secondary | ICD-10-CM | POA: Diagnosis not present

## 2019-07-23 LAB — LIPID PANEL
Cholesterol: 140 mg/dL (ref 0–200)
HDL: 36.2 mg/dL — ABNORMAL LOW (ref >39.00)
NonHDL: 103.45
Total CHOL/HDL Ratio: 4
Triglycerides: 301 mg/dL — ABNORMAL HIGH (ref 0.0–149.0)
VLDL: 60.2 mg/dL — ABNORMAL HIGH (ref 0.0–40.0)

## 2019-07-23 LAB — LDL CHOLESTEROL, DIRECT: Direct LDL: 70 mg/dL

## 2019-07-27 ENCOUNTER — Other Ambulatory Visit: Payer: Self-pay | Admitting: Family Medicine

## 2019-07-27 DIAGNOSIS — E781 Pure hyperglyceridemia: Secondary | ICD-10-CM

## 2019-07-27 MED ORDER — FENOFIBRATE 145 MG PO TABS: 145.0000 mg | ORAL_TABLET | Freq: Every day | ORAL | 3 refills | Status: DC

## 2019-08-14 ENCOUNTER — Other Ambulatory Visit: Payer: Self-pay

## 2019-09-04 ENCOUNTER — Other Ambulatory Visit: Payer: Self-pay

## 2019-09-07 ENCOUNTER — Ambulatory Visit (INDEPENDENT_AMBULATORY_CARE_PROVIDER_SITE_OTHER): Payer: PPO | Admitting: Family Medicine

## 2019-09-07 ENCOUNTER — Encounter: Payer: Self-pay | Admitting: Family Medicine

## 2019-09-07 ENCOUNTER — Other Ambulatory Visit: Payer: Self-pay

## 2019-09-07 VITALS — BP 112/78 | HR 74 | Temp 97.4°F | Ht 63.0 in | Wt 166.0 lb

## 2019-09-07 DIAGNOSIS — E119 Type 2 diabetes mellitus without complications: Secondary | ICD-10-CM

## 2019-09-07 DIAGNOSIS — I10 Essential (primary) hypertension: Secondary | ICD-10-CM | POA: Diagnosis not present

## 2019-09-07 DIAGNOSIS — E781 Pure hyperglyceridemia: Secondary | ICD-10-CM

## 2019-09-07 NOTE — Patient Instructions (Signed)
Schedule your eye doctor appointment.   Keep the diet clean and stay active.  Give [place] 2-3 business days to get the results of your labs back.   Let us know if you need anything.

## 2019-09-07 NOTE — Progress Notes (Signed)
Subjective:   Chief Complaint  Patient presents with  . Follow-up    Scott Davila is a 68 y.o. male here for follow-up of diabetes.   [place] does not monitor his sugars.  Patient does not require insulin.   Medications include: Metformin XR 1000 mg/d Exercise: walking, yoga, wt resistance exercise Diet is healthy.   Hypertension Patient presents for hypertension follow up. He does not monitor home blood pressures. He is compliant with medications- Toprol 50 mg/d, Exforge, HCTZ 25 mg/d. Patient has these side effects of medication: none Diet/exercise as above.   Hyperlipidemia Patient presents for dyslipidemia follow up. Currently being treated with Zocor 40 mg/d and compliance with treatment thus far has been good. He denies myalgias. Diet and exercise as above. The patient is not known to have coexisting coronary artery disease.  Past Medical History:  Diagnosis Date  . Colon polyps   . Diabetes mellitus without complication ([place])   . Heart murmur   . Hyperlipidemia   . Hypertension      Related testing: Date of retinal exam: Due Pneumovax: done Flu Shot: done  Review of Systems: Pulmonary:  No SOB Cardiovascular:  No chest pain  Objective:  BP 112/78 (BP Location: Left Arm, Patient Position: Sitting, Cuff Size: Normal)   Pulse 74   Temp (!) 97.4 F (36.3 C) (Temporal)   Ht 5\' 3"  (1.6 m)   Wt 166 lb (75.3 kg)   SpO2 98%   BMI 29.41 kg/m  General:  Well developed, well nourished, in no apparent distress Skin:  Warm, no pallor or diaphoresis Head:  Normocephalic, atraumatic Eyes:  Pupils equal and round, sclera anicteric without injection  Lungs:  CTAB, no access msc use Cardio:  RRR, no bruits, no LE edema Musculoskeletal:  Symmetrical muscle groups noted without atrophy or deformityt Psych: Age appropriate judgment and insight  Assessment:   Diabetes mellitus type 2 in nonobese (HCC) - Plan: HgB A1c  Essential  hypertension  Hypertriglyceridemia   Plan:   Check A1c, counseled on diet and exercise.  Needs to schedule eye exam.  Continue medications, may need to add Vascepa.  Cost is a barrier. Follow-up in 6 months if labs are unremarkable. The patient voiced understanding and agreement to the plan.  [place], DO 09/07/19 12:09 PM

## 2019-09-28 ENCOUNTER — Other Ambulatory Visit (INDEPENDENT_AMBULATORY_CARE_PROVIDER_SITE_OTHER): Payer: PPO

## 2019-09-28 ENCOUNTER — Other Ambulatory Visit: Payer: Self-pay | Admitting: Family Medicine

## 2019-09-28 ENCOUNTER — Encounter: Payer: Self-pay | Admitting: Family Medicine

## 2019-09-28 ENCOUNTER — Other Ambulatory Visit: Payer: Self-pay

## 2019-09-28 DIAGNOSIS — E781 Pure hyperglyceridemia: Secondary | ICD-10-CM | POA: Diagnosis not present

## 2019-09-28 DIAGNOSIS — E119 Type 2 diabetes mellitus without complications: Secondary | ICD-10-CM

## 2019-09-28 LAB — LDL CHOLESTEROL, DIRECT: Direct LDL: 86 mg/dL

## 2019-09-28 LAB — LIPID PANEL
Cholesterol: 153 mg/dL (ref 0–200)
HDL: 38.6 mg/dL — ABNORMAL LOW (ref >39.00)
NonHDL: 114.35
Total CHOL/HDL Ratio: 4
Triglycerides: 243 mg/dL — ABNORMAL HIGH (ref 0.0–149.0)
VLDL: 48.6 mg/dL — ABNORMAL HIGH (ref 0.0–40.0)

## 2019-09-28 LAB — HEMOGLOBIN A1C: Hgb A1c MFr Bld: 7.9 % — ABNORMAL HIGH (ref 4.6–6.5)

## 2019-10-20 ENCOUNTER — Other Ambulatory Visit: Payer: Self-pay | Admitting: Family Medicine

## 2019-10-20 MED ORDER — FENOFIBRATE 160 MG PO TABS: 160.0000 mg | ORAL_TABLET | Freq: Every day | ORAL | 3 refills | Status: DC

## 2019-10-20 NOTE — Telephone Encounter (Signed)
That's fine. Ty.  

## 2019-10-20 NOTE — Telephone Encounter (Signed)
Change to fenofibrate 160 mg from the 145 mg due to back order.

## 2019-10-20 NOTE — Telephone Encounter (Signed)
Fenofibrate 145 mg is on back order is it ok to change to fenofibrate 160 mg?

## 2019-10-20 NOTE — Addendum Note (Signed)
Addended by: Scharlene Gloss B on: 10/20/2019 12:45 PM   Modules accepted: Orders

## 2019-11-04 ENCOUNTER — Other Ambulatory Visit: Payer: Self-pay | Admitting: Family Medicine

## 2019-11-04 DIAGNOSIS — I10 Essential (primary) hypertension: Secondary | ICD-10-CM

## 2019-11-08 ENCOUNTER — Ambulatory Visit: Payer: PPO | Attending: Internal Medicine

## 2019-11-08 DIAGNOSIS — Z23 Encounter for immunization: Secondary | ICD-10-CM | POA: Insufficient documentation

## 2019-11-08 NOTE — Progress Notes (Signed)
   Covid-19 Vaccination Clinic  Name:  Raymel Cull    MRN: [id number] DOB: [birth year]  11/08/2019  Mr. Mcginley was observed post Covid-19 immunization for 15 minutes without incidence. He was provided with Vaccine Information Sheet and instruction to access the V-Safe system.   Mr. Yang was instructed to call 911 with any severe reactions post vaccine: [name] Difficulty breathing  . Swelling of your face and throat  . A fast heartbeat  . A bad rash all over your body  . Dizziness and weakness    Immunizations Administered    Name Date Dose VIS Date Route   Pfizer COVID-19 Vaccine 11/08/2019 11:06 AM 0.3 mL 09/04/2019 Intramuscular   Manufacturer: [employer], [employer]   Lot: [id number]   NDC: [address]-0

## 2019-11-25 DIAGNOSIS — H2513 Age-related nuclear cataract, bilateral: Secondary | ICD-10-CM | POA: Diagnosis not present

## 2019-11-25 DIAGNOSIS — H5203 Hypermetropia, bilateral: Secondary | ICD-10-CM | POA: Diagnosis not present

## 2019-11-25 DIAGNOSIS — H524 Presbyopia: Secondary | ICD-10-CM | POA: Diagnosis not present

## 2019-11-25 DIAGNOSIS — E119 Type 2 diabetes mellitus without complications: Secondary | ICD-10-CM | POA: Diagnosis not present

## 2019-11-25 DIAGNOSIS — H40023 Open angle with borderline findings, high risk, bilateral: Secondary | ICD-10-CM | POA: Diagnosis not present

## 2019-11-25 DIAGNOSIS — H0102A Squamous blepharitis right eye, upper and lower eyelids: Secondary | ICD-10-CM | POA: Diagnosis not present

## 2019-11-25 LAB — HM DIABETES EYE EXAM

## 2019-11-26 ENCOUNTER — Other Ambulatory Visit: Payer: Self-pay

## 2019-11-26 ENCOUNTER — Other Ambulatory Visit (INDEPENDENT_AMBULATORY_CARE_PROVIDER_SITE_OTHER): Payer: PPO

## 2019-11-26 DIAGNOSIS — E781 Pure hyperglyceridemia: Secondary | ICD-10-CM

## 2019-11-26 LAB — LIPID PANEL
Cholesterol: 133 mg/dL (ref 0–200)
HDL: 34.2 mg/dL — ABNORMAL LOW (ref >39.00)
LDL Cholesterol: 62 mg/dL (ref 0–99)
NonHDL: 99.05
Total CHOL/HDL Ratio: 4
Triglycerides: 186 mg/dL — ABNORMAL HIGH (ref 0.0–149.0)
VLDL: 37.2 mg/dL (ref 0.0–40.0)

## 2019-11-27 ENCOUNTER — Encounter: Payer: Self-pay | Admitting: Family Medicine

## 2019-12-01 ENCOUNTER — Ambulatory Visit: Payer: PPO | Attending: Internal Medicine

## 2019-12-01 DIAGNOSIS — M545 Low back pain: Secondary | ICD-10-CM | POA: Diagnosis not present

## 2019-12-01 DIAGNOSIS — Z23 Encounter for immunization: Secondary | ICD-10-CM | POA: Insufficient documentation

## 2019-12-01 NOTE — Progress Notes (Signed)
   Covid-19 Vaccination Clinic  Name:  Scott Davila    MRN: [id number] DOB: 19-Feb-1951  12/01/2019  [name] was observed post Covid-19 immunization for 15 minutes without incident. He was provided with Vaccine Information Sheet and instruction to access the V-Safe system.   Mr. Ran was instructed to call 911 with any severe reactions post vaccine: [name] Difficulty breathing  . Swelling of face and throat  . A fast heartbeat  . A bad rash all over body  . Dizziness and weakness   Immunizations Administered    Name Date Dose VIS Date Route   Pfizer COVID-19 Vaccine 12/01/2019  1:45 PM 0.3 mL 09/04/2019 Intramuscular   Manufacturer: [employer], [employer]   Lot: [id number]   NDC: [address]-2

## 2019-12-04 DIAGNOSIS — M545 Low back pain: Secondary | ICD-10-CM | POA: Diagnosis not present

## 2019-12-04 DIAGNOSIS — M6281 Muscle weakness (generalized): Secondary | ICD-10-CM | POA: Diagnosis not present

## 2019-12-06 ENCOUNTER — Other Ambulatory Visit: Payer: Self-pay | Admitting: Family Medicine

## 2019-12-11 DIAGNOSIS — M6281 Muscle weakness (generalized): Secondary | ICD-10-CM | POA: Diagnosis not present

## 2019-12-11 DIAGNOSIS — M545 Low back pain: Secondary | ICD-10-CM | POA: Diagnosis not present

## 2019-12-18 DIAGNOSIS — M545 Low back pain: Secondary | ICD-10-CM | POA: Diagnosis not present

## 2019-12-18 DIAGNOSIS — M6281 Muscle weakness (generalized): Secondary | ICD-10-CM | POA: Diagnosis not present

## 2019-12-19 ENCOUNTER — Other Ambulatory Visit: Payer: Self-pay | Admitting: Family Medicine

## 2019-12-24 DIAGNOSIS — M545 Low back pain: Secondary | ICD-10-CM | POA: Diagnosis not present

## 2019-12-24 DIAGNOSIS — M6281 Muscle weakness (generalized): Secondary | ICD-10-CM | POA: Diagnosis not present

## 2020-01-01 DIAGNOSIS — M6281 Muscle weakness (generalized): Secondary | ICD-10-CM | POA: Diagnosis not present

## 2020-01-01 DIAGNOSIS — M545 Low back pain: Secondary | ICD-10-CM | POA: Diagnosis not present

## 2020-01-07 ENCOUNTER — Other Ambulatory Visit: Payer: Self-pay | Admitting: Family Medicine

## 2020-01-07 DIAGNOSIS — I10 Essential (primary) hypertension: Secondary | ICD-10-CM

## 2020-01-11 ENCOUNTER — Other Ambulatory Visit: Payer: Self-pay | Admitting: Family Medicine

## 2020-01-16 ENCOUNTER — Other Ambulatory Visit: Payer: Self-pay | Admitting: Family Medicine

## 2020-03-09 ENCOUNTER — Encounter: Payer: Self-pay | Admitting: Family Medicine

## 2020-03-09 ENCOUNTER — Other Ambulatory Visit: Payer: Self-pay

## 2020-03-09 ENCOUNTER — Ambulatory Visit (INDEPENDENT_AMBULATORY_CARE_PROVIDER_SITE_OTHER): Payer: PPO | Admitting: Family Medicine

## 2020-03-09 VITALS — BP 108/68 | HR 69 | Temp 96.5°F | Ht 63.5 in | Wt 159.2 lb

## 2020-03-09 DIAGNOSIS — E119 Type 2 diabetes mellitus without complications: Secondary | ICD-10-CM | POA: Diagnosis not present

## 2020-03-09 DIAGNOSIS — Z0000 Encounter for general adult medical examination without abnormal findings: Secondary | ICD-10-CM | POA: Diagnosis not present

## 2020-03-09 NOTE — Patient Instructions (Signed)
Give [place] 2-3 business days to get the results of your labs back.   Keep the diet clean and stay active.  Strong work with our weight loss.  Let us know if you need anything.

## 2020-03-09 NOTE — Progress Notes (Signed)
Chief Complaint  Patient presents with   Follow-up    Well Male Scott Davila is here for a complete physical.   His last physical was >1 year ago.  Current diet: in general, a "healthy" diet.   Current exercise: wt resistance, walking, yoga Weight trend: decreasing (intentionally) Daytime fatigue? No. Seat belt? Yes.    Health maintenance Shingrix- Yes Colonoscopy- Yes Tetanus- Yes Hep C- Yes Pneumonia vaccine- Yes  Past Medical History:  Diagnosis Date   Colon polyps    Diabetes mellitus without complication (HCC)    Heart murmur    Hyperlipidemia    Hypertension      Past Surgical History:  Procedure Laterality Date   OTHER SURGICAL HISTORY  1992   Maxillary surgery after accident; rod placement in femur    Medications  Current Outpatient Medications on File Prior to Visit  Medication Sig Dispense Refill   amLODipine-valsartan (EXFORGE) 5-320 MG tablet TAKE 1 TABLET BY MOUTH EVERY DAY 90 tablet 1   aspirin EC 81 MG tablet Take 81 mg by mouth daily.     B Complex Vitamins (B COMPLEX 1 PO) Take 1 tablet by mouth daily.     cholecalciferol (VITAMIN D) 400 units TABS tablet Take 400 Units by mouth daily.     co-enzyme Q-10 30 MG capsule Take 30 mg by mouth 3 (three) times daily.     fenofibrate 160 MG tablet TAKE 1 TABLET BY MOUTH EVERY DAY 90 tablet 1   hydrochlorothiazide (HYDRODIURIL) 25 MG tablet TAKE 1 TABLET BY MOUTH EVERY DAY 90 tablet 2   Magnesium 250 MG TABS Take 1 tablet by mouth daily.     metFORMIN (GLUCOPHAGE-XR) 500 MG 24 hr tablet Take 500 mg by mouth daily with breakfast.     metoprolol succinate (TOPROL-XL) 50 MG 24 hr tablet TAKE 1 TABLET (50 MG TOTAL) BY MOUTH DAILY. TAKE WITH OR IMMEDIATELY FOLLOWING A MEAL. 90 tablet 1   Multiple Vitamins-Minerals (SENIOR MULTIVITAMIN PLUS PO) Take by mouth.     Omega-3 Fatty Acids (FISH OIL) 1000 MG CAPS Take 1 capsule by mouth daily.     simvastatin (ZOCOR) 40 MG tablet TAKE 1 TABLET BY  MOUTH EVERYDAY AT BEDTIME 90 tablet 1   vitamin C (ASCORBIC ACID) 500 MG tablet Take 500 mg by mouth daily.     Allergies Allergies  Allergen Reactions   Penicillins     Family History Family History  Problem Relation Age of Onset   Arthritis Mother    Hearing loss Mother    Hyperlipidemia Mother    Hypertension Mother    Dementia Mother    Dementia Father    Muscular dystrophy Father    COPD Sister    Alcohol abuse Brother    COPD Maternal Grandmother    Hearing loss Maternal Grandmother    Diabetes Maternal Grandfather    Heart disease Paternal Grandfather    Heart attack Paternal Grandfather     Review of Systems: Constitutional:  no fevers Eye:  no recent significant change in vision Ears:  No changes in hearing Nose/Mouth/Throat:  no complaints of nasal congestion, no sore throat Cardiovascular: no chest pain Respiratory:  No shortness of breath Gastrointestinal:  No change in bowel habits GU:  No frequency Integumentary:  no abnormal skin lesions reported Neurologic:  no headaches Endocrine:  denies unexplained weight changes  Exam BP 108/68 (BP Location: Left Arm, Patient Position: Sitting, Cuff Size: Normal)    Pulse 69    Temp [name])  96.5 F (35.8 C) (Temporal)    Ht 5' 3.5" (1.613 m)    Wt 159 lb 4 oz (72.2 kg)    SpO2 94%    BMI 27.77 kg/m  General:  well developed, well nourished, in no apparent distress Skin:  no significant moles, warts, or growths Head:  no masses, lesions, or tenderness Eyes:  pupils equal and round, sclera anicteric without injection Ears:  canals without lesions, TMs shiny without retraction, no obvious effusion, no erythema Nose:  nares patent, septum midline, mucosa normal Throat/Pharynx:  lips and gingiva without lesion; tongue and uvula midline; non-inflamed pharynx; no exudates or postnasal drainage Lungs:  clear to auscultation, breath sounds equal bilaterally, no respiratory distress Cardio:  regular rate and  rhythm, no LE edema or bruits Rectal: Deferred GI: BS+, S, NT, ND, no masses or organomegaly Musculoskeletal:  symmetrical muscle groups noted without atrophy or deformity Neuro:  gait normal; deep tendon reflexes normal and symmetric Psych: well oriented with normal range of affect and appropriate judgment/insight  Assessment and Plan  Well adult exam - Plan: CBC, Comprehensive metabolic panel, Lipid panel  Diabetes mellitus type 2 in nonobese (HCC) - Plan: Hemoglobin A1c, Microalbumin / creatinine urine ratio   Well 69 y.o. male. Counseled on diet and exercise. He has done well w wt loss.  Other orders as above. Follow up in 6 mo pending above.  The patient voiced understanding and agreement to the plan.  [place], DO 03/09/20 12:15 PM

## 2020-03-18 ENCOUNTER — Other Ambulatory Visit (INDEPENDENT_AMBULATORY_CARE_PROVIDER_SITE_OTHER): Payer: PPO

## 2020-03-18 ENCOUNTER — Other Ambulatory Visit: Payer: Self-pay

## 2020-03-18 DIAGNOSIS — E119 Type 2 diabetes mellitus without complications: Secondary | ICD-10-CM | POA: Diagnosis not present

## 2020-03-18 DIAGNOSIS — Z0000 Encounter for general adult medical examination without abnormal findings: Secondary | ICD-10-CM | POA: Diagnosis not present

## 2020-03-18 LAB — COMPREHENSIVE METABOLIC PANEL
ALT: 19 U/L (ref 0–53)
AST: 17 U/L (ref 0–37)
Albumin: 4.8 g/dL (ref 3.5–5.2)
Alkaline Phosphatase: 35 U/L — ABNORMAL LOW (ref 39–117)
BUN: 23 mg/dL (ref 6–23)
CO2: 30 mEq/L (ref 19–32)
Calcium: 10 mg/dL (ref 8.4–10.5)
Chloride: 103 mEq/L (ref 96–112)
Creatinine, Ser: 1.3 mg/dL (ref 0.40–1.50)
GFR: 54.75 mL/min — ABNORMAL LOW (ref >60.00)
Glucose, Bld: 119 mg/dL — ABNORMAL HIGH (ref 70–99)
Potassium: 4.2 mEq/L (ref 3.5–5.1)
Sodium: 143 mEq/L (ref 135–145)
Total Bilirubin: 0.6 mg/dL (ref 0.2–1.2)
Total Protein: 6.7 g/dL (ref 6.0–8.3)

## 2020-03-18 LAB — LIPID PANEL
Cholesterol: 115 mg/dL (ref 0–200)
HDL: 34 mg/dL — ABNORMAL LOW (ref >39.00)
LDL Cholesterol: 52 mg/dL (ref 0–99)
NonHDL: 81.22
Total CHOL/HDL Ratio: 3
Triglycerides: 148 mg/dL (ref 0.0–149.0)
VLDL: 29.6 mg/dL (ref 0.0–40.0)

## 2020-03-18 LAB — CBC
HCT: 45.5 % (ref 39.0–52.0)
Hemoglobin: 15 g/dL (ref 13.0–17.0)
MCHC: 33 g/dL (ref 30.0–36.0)
MCV: 91.3 fl (ref 78.0–100.0)
Platelets: 181 10*3/uL (ref 150.0–400.0)
RBC: 4.99 Mil/uL (ref 4.22–5.81)
RDW: 13.1 % (ref 11.5–15.5)
WBC: 6.5 10*3/uL (ref 4.0–10.5)

## 2020-03-18 LAB — HEMOGLOBIN A1C: Hgb A1c MFr Bld: 6.6 % — ABNORMAL HIGH (ref 4.6–6.5)

## 2020-03-18 LAB — MICROALBUMIN / CREATININE URINE RATIO
Creatinine,U: 150.3 mg/dL
Microalb Creat Ratio: 1.1 mg/g (ref 0.0–30.0)
Microalb, Ur: 1.7 mg/dL (ref 0.0–1.9)

## 2020-04-19 ENCOUNTER — Other Ambulatory Visit: Payer: Self-pay

## 2020-04-23 ENCOUNTER — Other Ambulatory Visit: Payer: Self-pay | Admitting: Family Medicine

## 2020-04-23 DIAGNOSIS — I10 Essential (primary) hypertension: Secondary | ICD-10-CM

## 2020-06-01 DIAGNOSIS — E119 Type 2 diabetes mellitus without complications: Secondary | ICD-10-CM | POA: Diagnosis not present

## 2020-06-01 DIAGNOSIS — H2513 Age-related nuclear cataract, bilateral: Secondary | ICD-10-CM | POA: Diagnosis not present

## 2020-06-01 DIAGNOSIS — H40023 Open angle with borderline findings, high risk, bilateral: Secondary | ICD-10-CM | POA: Diagnosis not present

## 2020-06-01 DIAGNOSIS — H0102A Squamous blepharitis right eye, upper and lower eyelids: Secondary | ICD-10-CM | POA: Diagnosis not present

## 2020-06-12 ENCOUNTER — Other Ambulatory Visit: Payer: Self-pay | Admitting: Family Medicine

## 2020-07-08 ENCOUNTER — Other Ambulatory Visit: Payer: Self-pay | Admitting: Family Medicine

## 2020-07-11 ENCOUNTER — Other Ambulatory Visit: Payer: Self-pay | Admitting: Family Medicine

## 2020-07-12 ENCOUNTER — Other Ambulatory Visit: Payer: Self-pay | Admitting: Family Medicine

## 2020-07-18 IMAGING — CR DG CHEST 2V
2 series · 2 of 2 positions shown · non-contrast
Comparison: None.

CLINICAL DATA: Chest pain.

EXAM:
CHEST - 2 VIEW

[w chest pa]
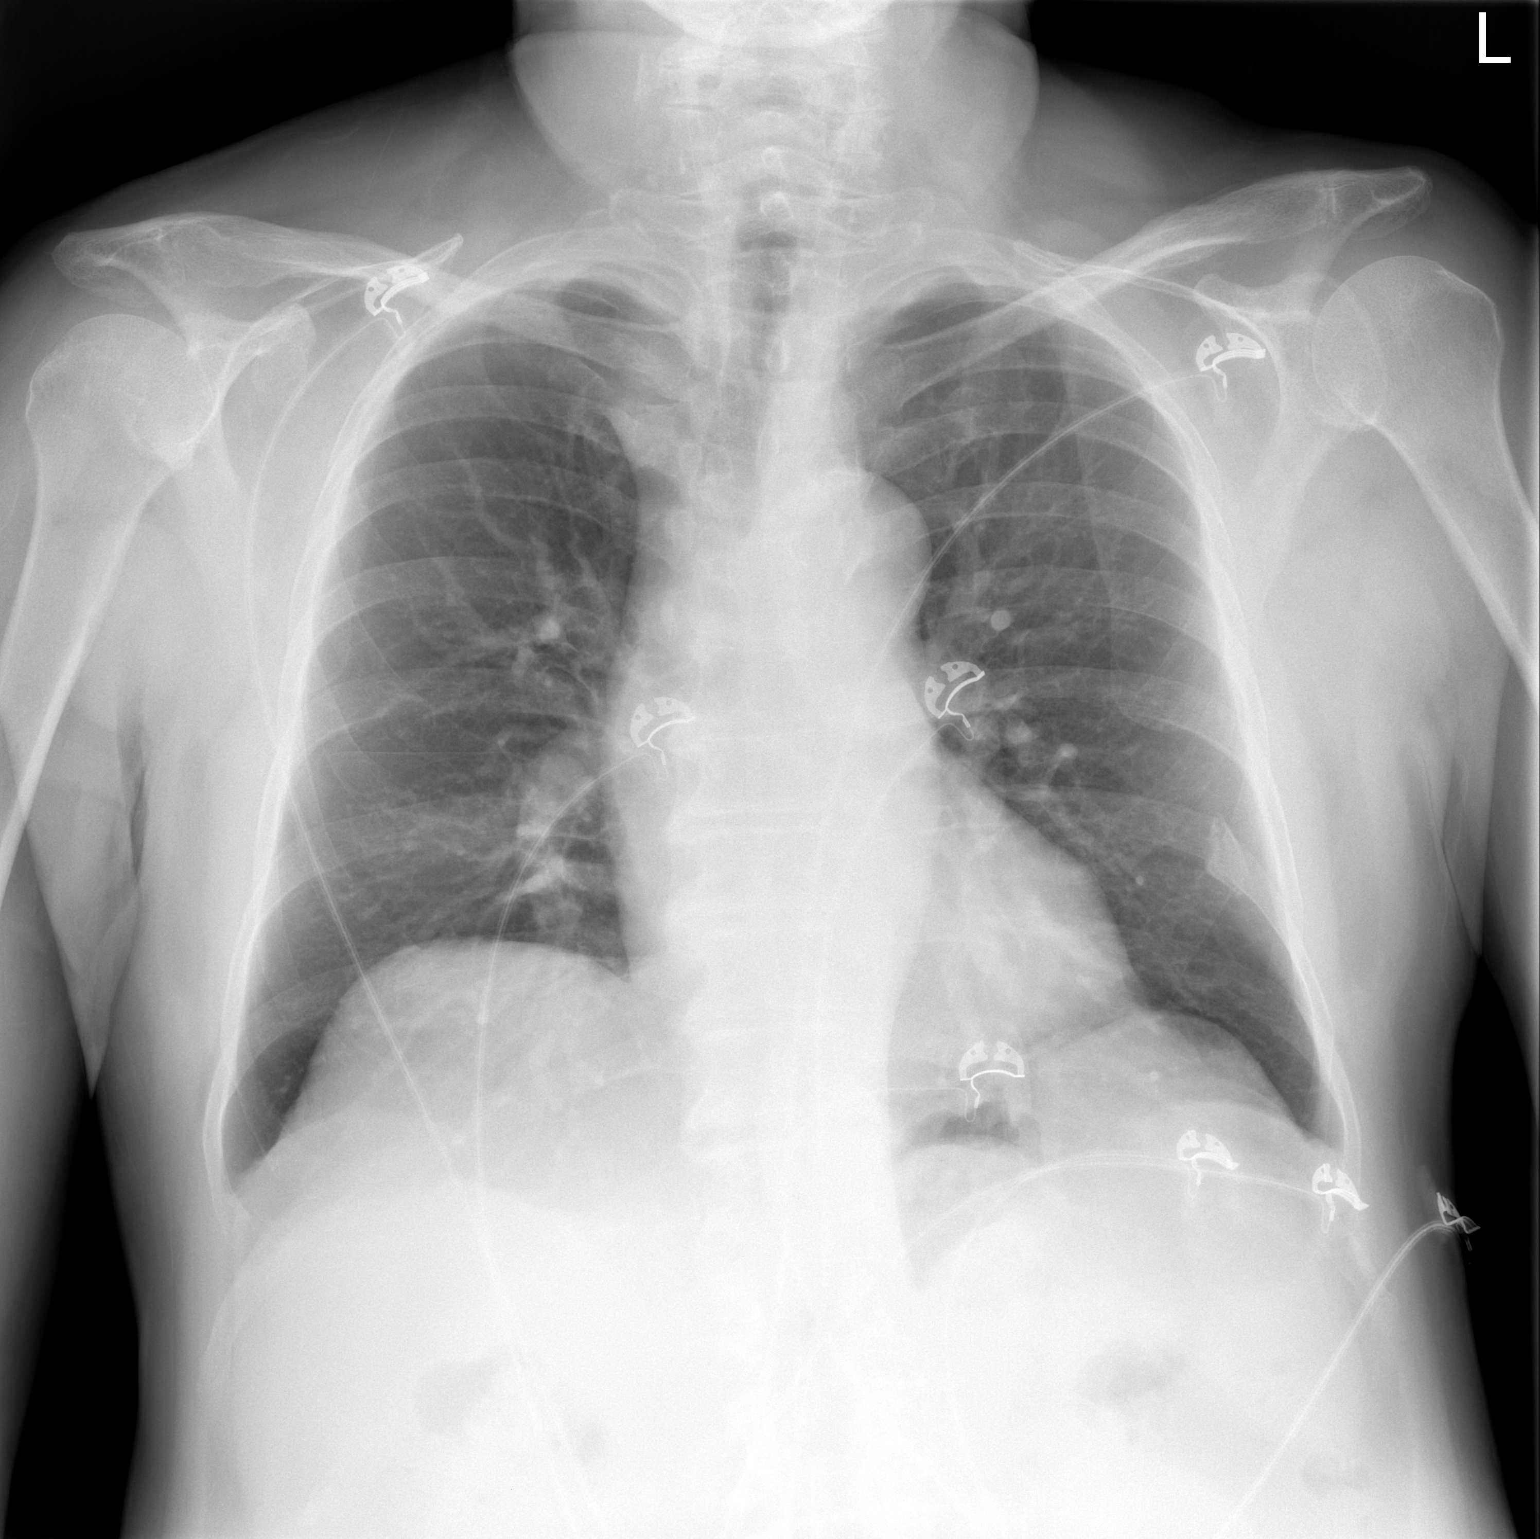

[w chest lat]
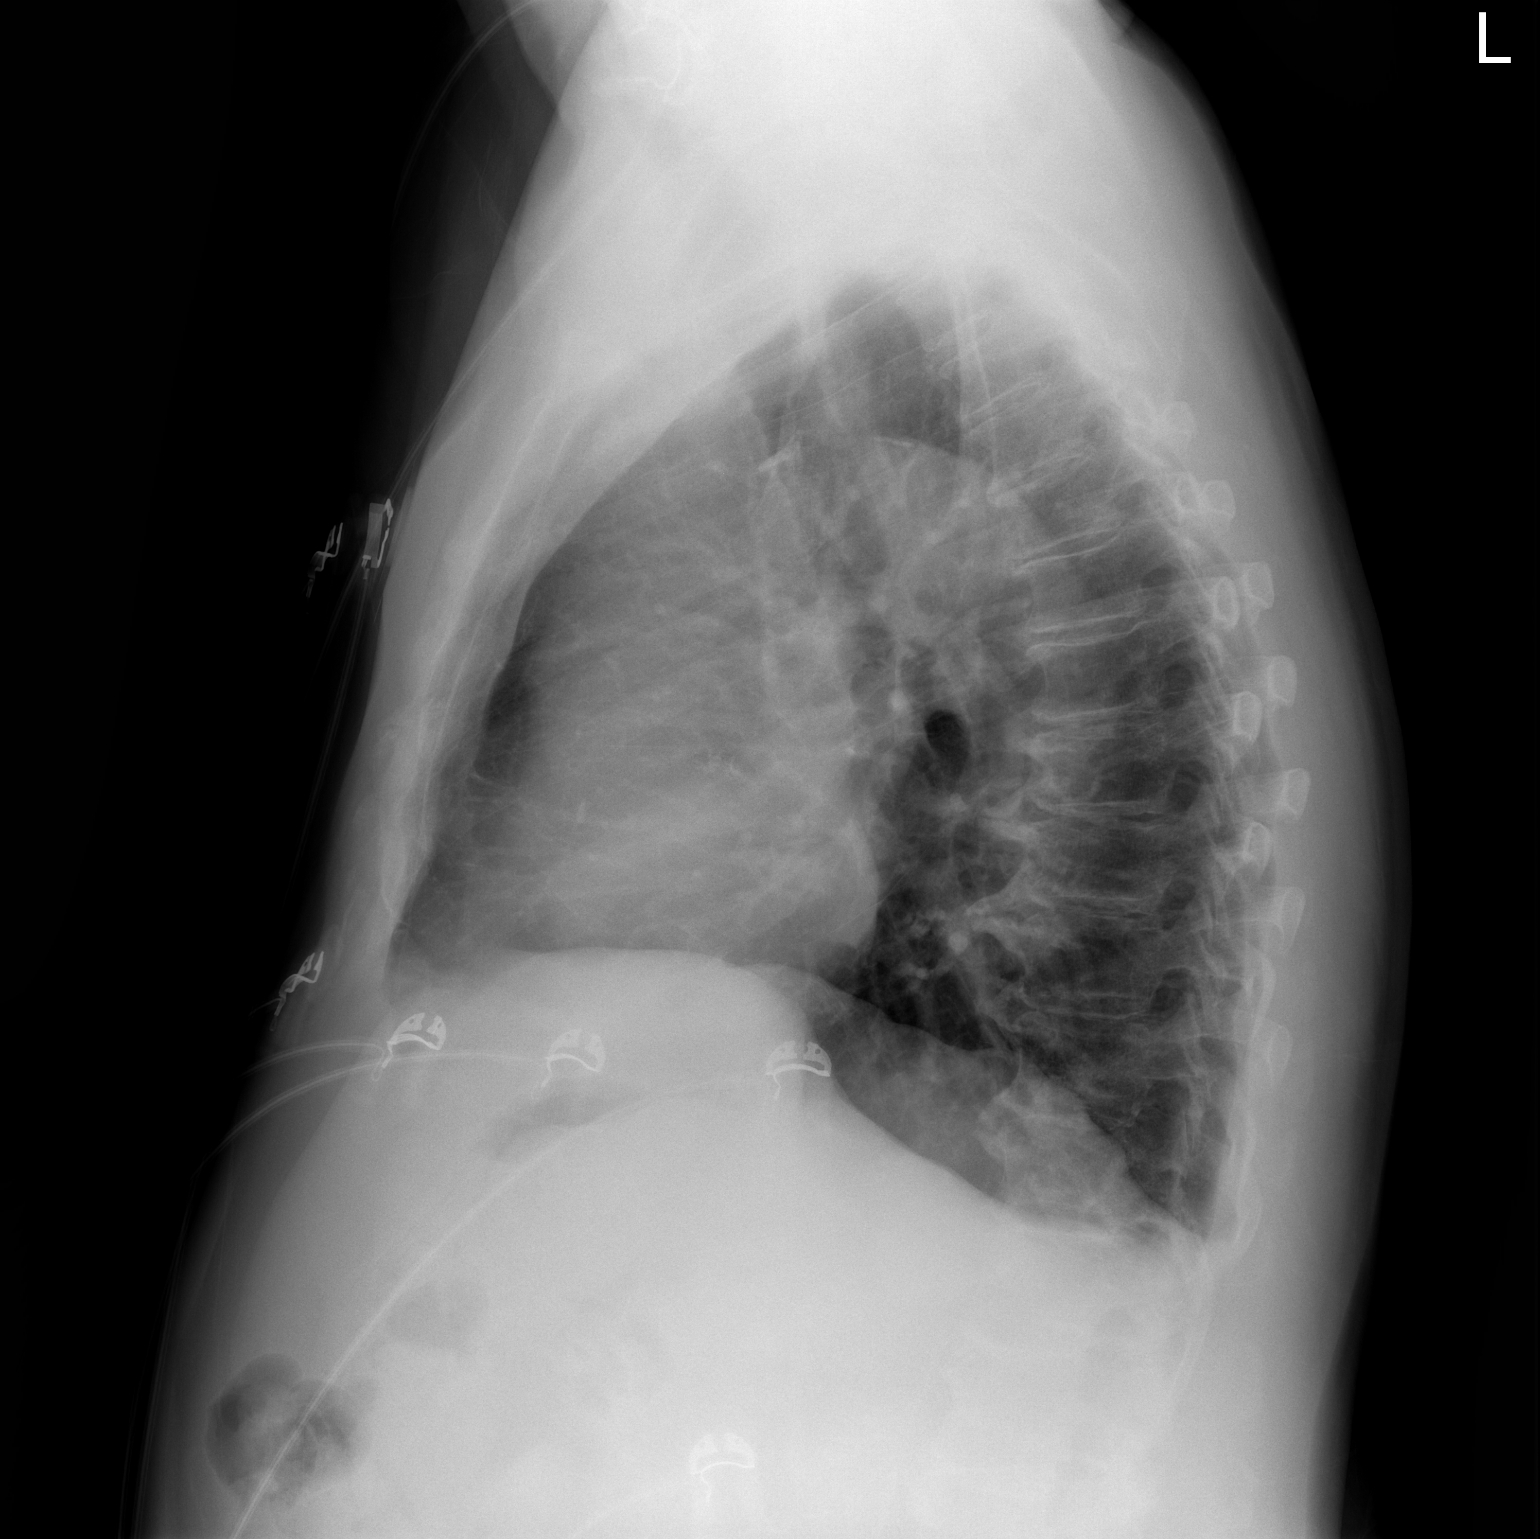

[2 of 2 positions shown; findings below may reference images not displayed]

FINDINGS: Heart size and pulmonary vascularity are normal. Lungs are clear.
Aortic atherosclerosis. No acute bone abnormality. Old fracture of
the posterolateral aspect of the left ninth rib.
IMPRESSION: No active cardiopulmonary disease.

Aortic Atherosclerosis ([id number]-[id number].L).

## 2020-08-05 ENCOUNTER — Other Ambulatory Visit: Payer: Self-pay | Admitting: Family Medicine

## 2020-08-05 DIAGNOSIS — I10 Essential (primary) hypertension: Secondary | ICD-10-CM

## 2020-09-13 ENCOUNTER — Other Ambulatory Visit: Payer: Self-pay

## 2020-09-13 ENCOUNTER — Ambulatory Visit (INDEPENDENT_AMBULATORY_CARE_PROVIDER_SITE_OTHER): Payer: PPO | Admitting: Family Medicine

## 2020-09-13 ENCOUNTER — Encounter: Payer: Self-pay | Admitting: Family Medicine

## 2020-09-13 VITALS — BP 130/80 | HR 64 | Temp 98.2°F | Ht 63.5 in | Wt 157.1 lb

## 2020-09-13 DIAGNOSIS — I10 Essential (primary) hypertension: Secondary | ICD-10-CM

## 2020-09-13 DIAGNOSIS — E119 Type 2 diabetes mellitus without complications: Secondary | ICD-10-CM

## 2020-09-13 DIAGNOSIS — Z23 Encounter for immunization: Secondary | ICD-10-CM

## 2020-09-13 LAB — HEMOGLOBIN A1C: Hgb A1c MFr Bld: 6.8 % — ABNORMAL HIGH (ref 4.6–6.5)

## 2020-09-13 NOTE — Progress Notes (Signed)
Subjective:   Chief Complaint  Patient presents with  . Follow-up    Scott Davila is a 69 y.o. male here for follow-up of diabetes.   [name] does not routinely monitor sugars.  Patient does not require insulin.   Medications include: metformin XR 500 mg/d Diet is healthy overall.  Exercise: walking  Hypertension Patient presents for hypertension follow up. He does not routinely monitor home blood pressures. He is compliant with medications- Exforge 5-320 mg/d, HCTZ 25 mg/d, Toprol XL 50 mg/d. Patient has these side effects of medication: none Diet/exercise as above.    Past Medical History:  Diagnosis Date  . Colon polyps   . Diabetes mellitus without complication (HCC)   . Heart murmur   . Hyperlipidemia   . Hypertension      Related testing: Retinal exam: Done Pneumovax: done  Objective:  BP 130/80 (BP Location: Left Arm, Patient Position: Sitting, Cuff Size: Normal)   Pulse 64   Temp 98.2 F (36.8 C) (Oral)   Ht 5' 3.5" (1.613 m)   Wt 157 lb 2 oz (71.3 kg)   SpO2 97%   BMI 27.40 kg/m  General:  Well developed, well nourished, in no apparent distress Skin:  Warm, no pallor or diaphoresis Head:  Normocephalic, atraumatic Eyes:  Pupils equal and round, sclera anicteric without injection  Lungs:  CTAB, no access msc use Cardio:  RRR, no bruits, no LE edema Psych: Age appropriate judgment and insight  Assessment:   Diabetes mellitus type 2 in nonobese (HCC) - Plan: Hemoglobin A1c  Essential hypertension  Need for Tdap vaccination - Plan: Tdap vaccine greater than or equal to 7yo IM   Plan:   1. Counseled on diet and exercise.  Check A1c.  Does not need to monitor sugars at home.  Continue Metformin XR 500 mg daily and statin. 2.  Continue Exforge 5-320 mg daily and hydrochlorothiazide 25 mg daily, Toprol-XL 50 mg daily.  Does not need to monitor blood pressures at home anymore. F/u in 6 mo. The patient voiced understanding and agreement to the  plan.  Scott Roche [place], DO 09/13/20 11:26 AM

## 2020-09-13 NOTE — Patient Instructions (Signed)
Give us 2-3 business days to get the results of your labs back.   Keep the diet clean and stay active.  Let us know if you need anything. 

## 2020-10-18 ENCOUNTER — Other Ambulatory Visit: Payer: Self-pay | Admitting: Family Medicine

## 2020-10-18 DIAGNOSIS — I10 Essential (primary) hypertension: Secondary | ICD-10-CM

## 2020-12-03 ENCOUNTER — Other Ambulatory Visit: Payer: Self-pay | Admitting: Family Medicine

## 2020-12-05 DIAGNOSIS — H5203 Hypermetropia, bilateral: Secondary | ICD-10-CM | POA: Diagnosis not present

## 2020-12-05 DIAGNOSIS — H40023 Open angle with borderline findings, high risk, bilateral: Secondary | ICD-10-CM | POA: Diagnosis not present

## 2020-12-05 DIAGNOSIS — E119 Type 2 diabetes mellitus without complications: Secondary | ICD-10-CM | POA: Diagnosis not present

## 2020-12-05 DIAGNOSIS — H0102A Squamous blepharitis right eye, upper and lower eyelids: Secondary | ICD-10-CM | POA: Diagnosis not present

## 2020-12-05 DIAGNOSIS — H524 Presbyopia: Secondary | ICD-10-CM | POA: Diagnosis not present

## 2020-12-05 DIAGNOSIS — H2513 Age-related nuclear cataract, bilateral: Secondary | ICD-10-CM | POA: Diagnosis not present

## 2020-12-30 ENCOUNTER — Other Ambulatory Visit: Payer: Self-pay | Admitting: Family Medicine

## 2021-01-03 ENCOUNTER — Other Ambulatory Visit: Payer: Self-pay | Admitting: Family Medicine

## 2021-01-11 IMAGING — US US ABDOMINAL AORTA SCREENING AAA
1 series · 14 of 25 positions shown · non-contrast
Comparison: None.

CLINICAL DATA: Male between 65-75 years of age with a smoking
history.

EXAM:
US ABDOMINAL AORTA MEDICARE SCREENING
TECHNIQUE: Ultrasound examination of the abdominal aorta was performed as a
screening evaluation for abdominal aortic aneurysm.

[Series 1: us abdominal aorta screening aaa · 14 of 39 slices shown]
[im 1/39]
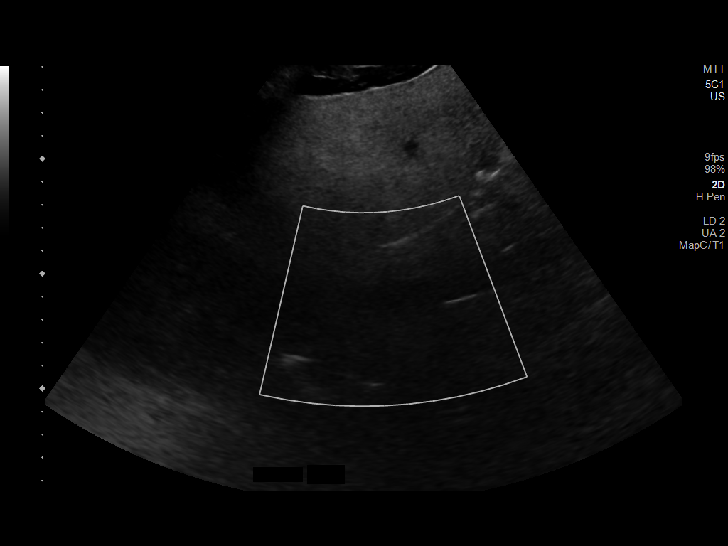
[im 4/39]
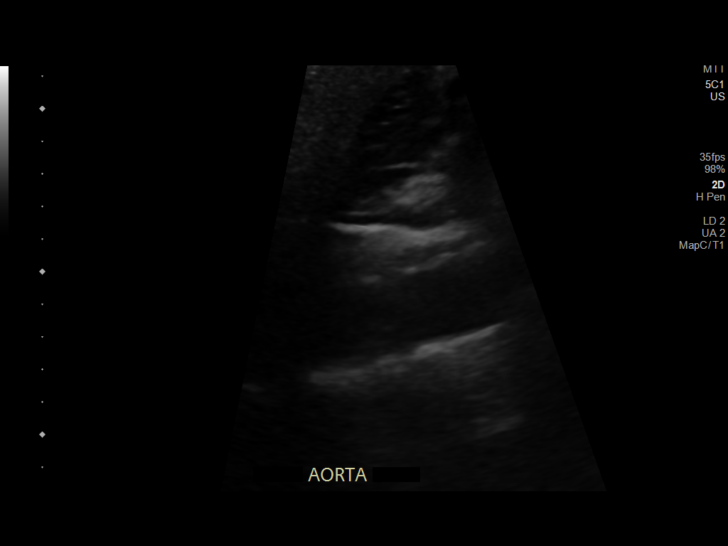
[im 7/39]
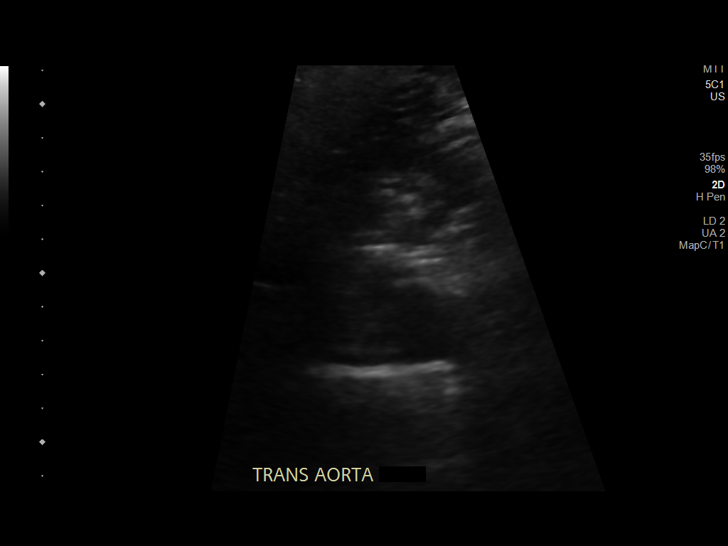
[im 10/39]
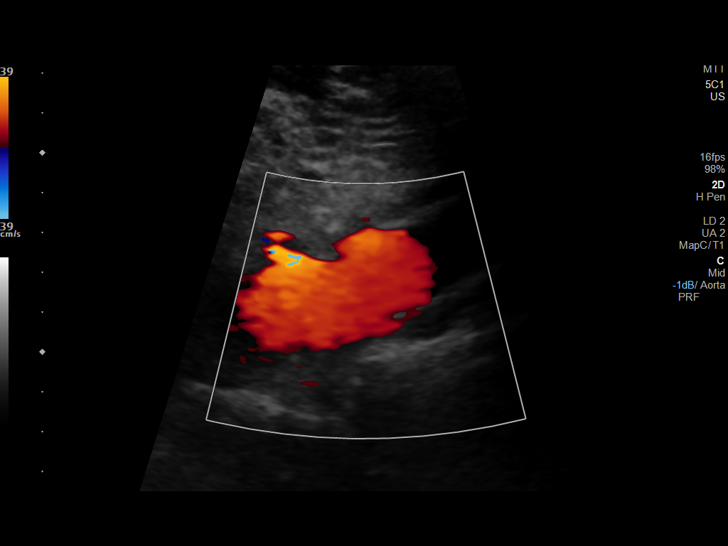
[im 13/39]
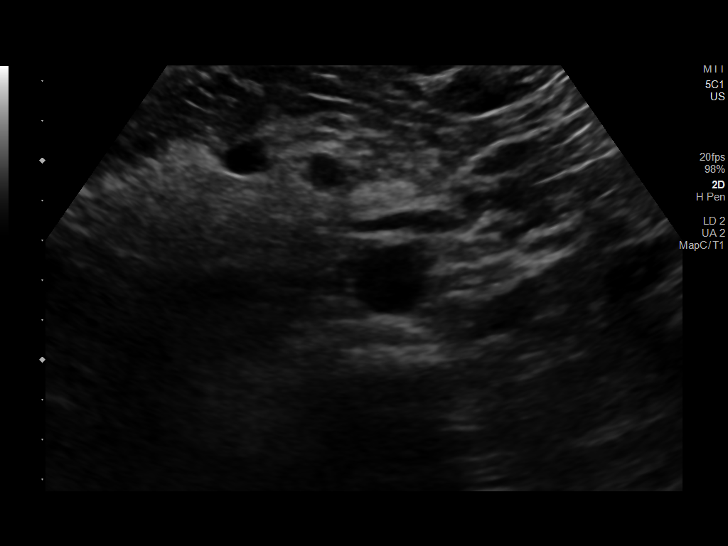
[im 15/39]
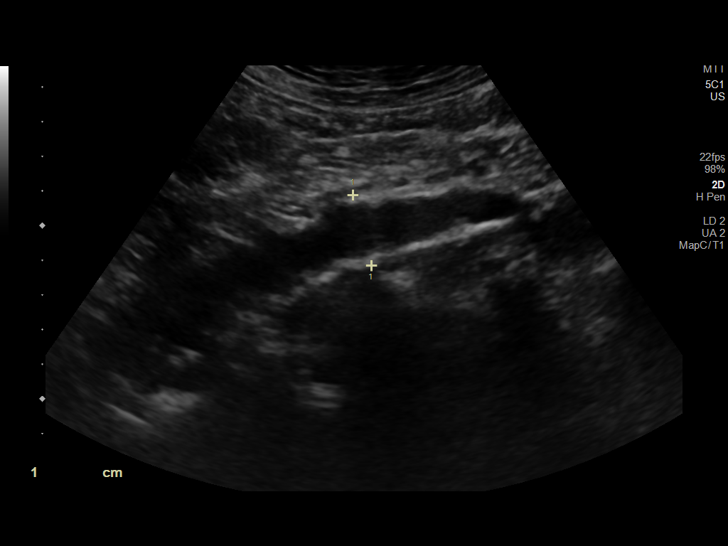
[im 18/39]
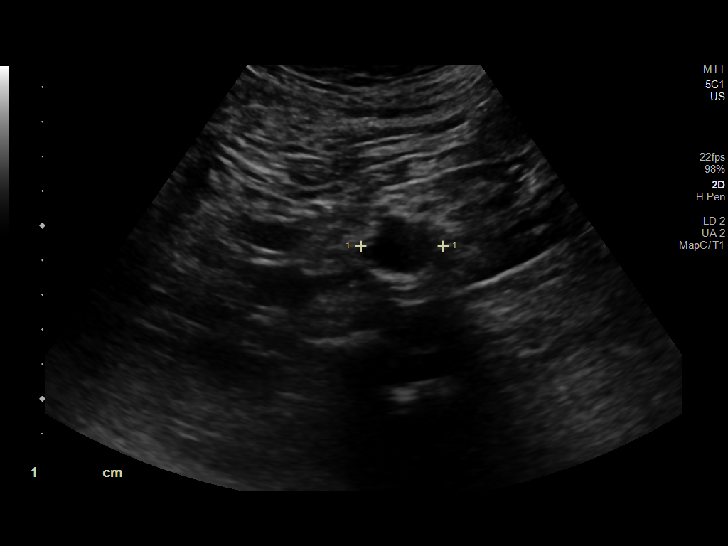
[im 21/39]
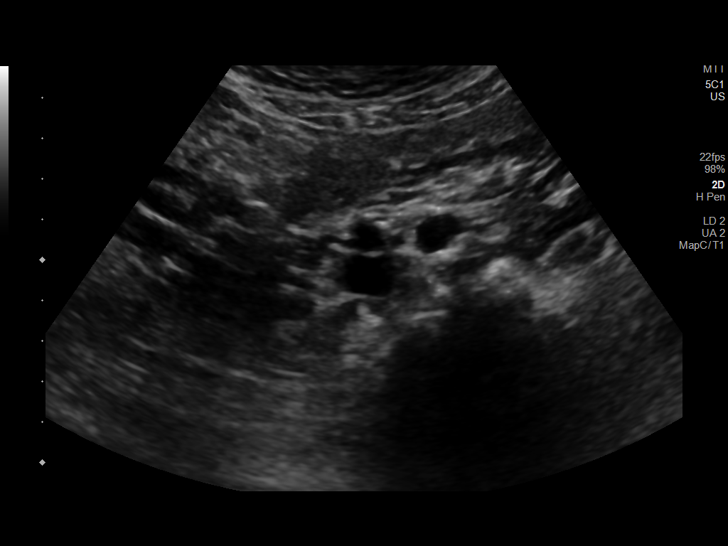
[im 24/39]
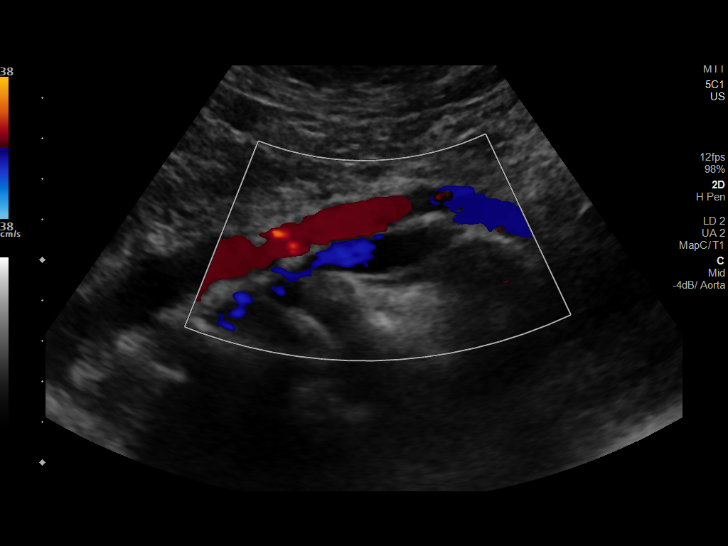
[im 26/39]
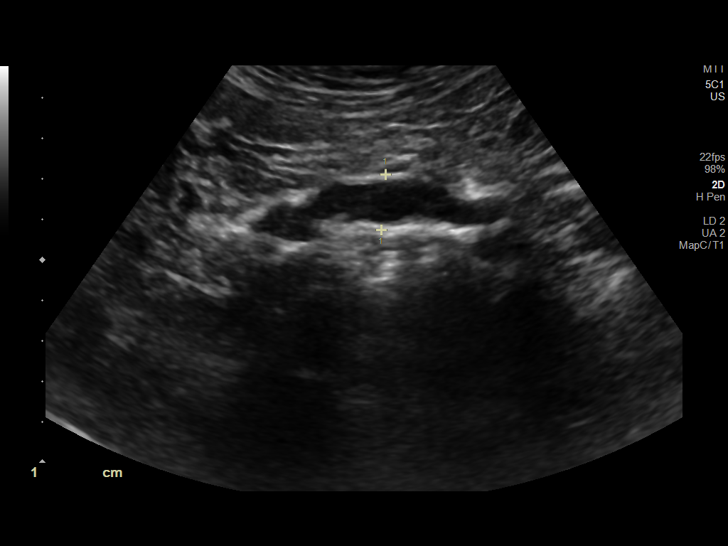
[im 29/39]
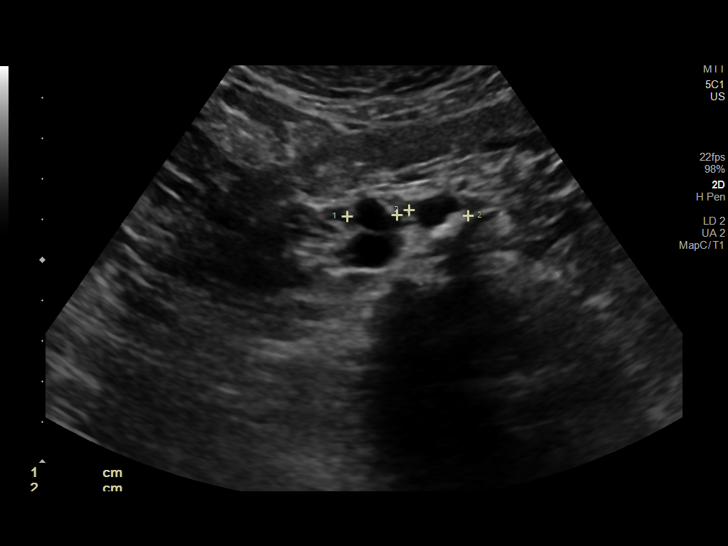
[im 32/39]
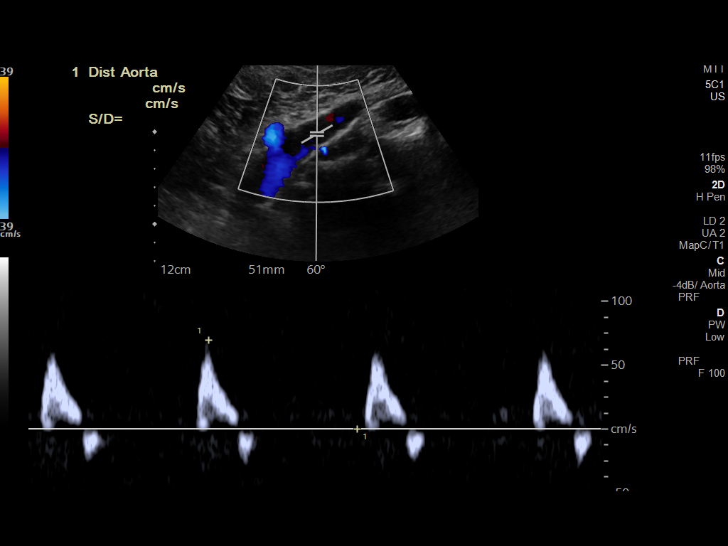
[im 35/39]
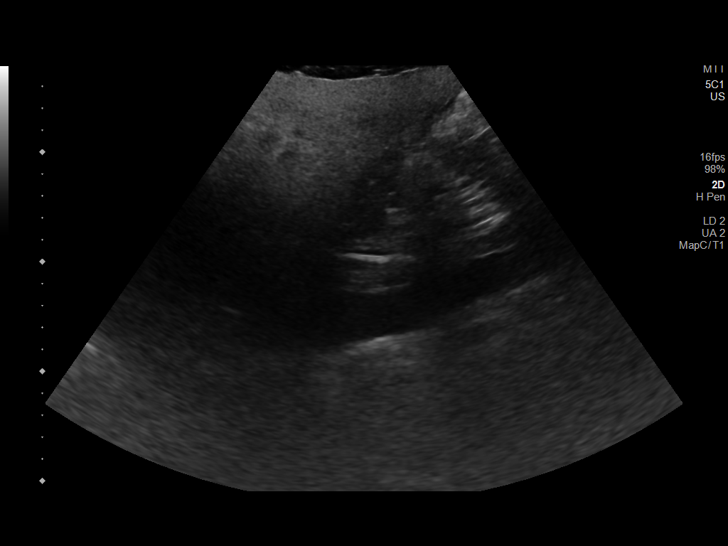
[im 39/39]
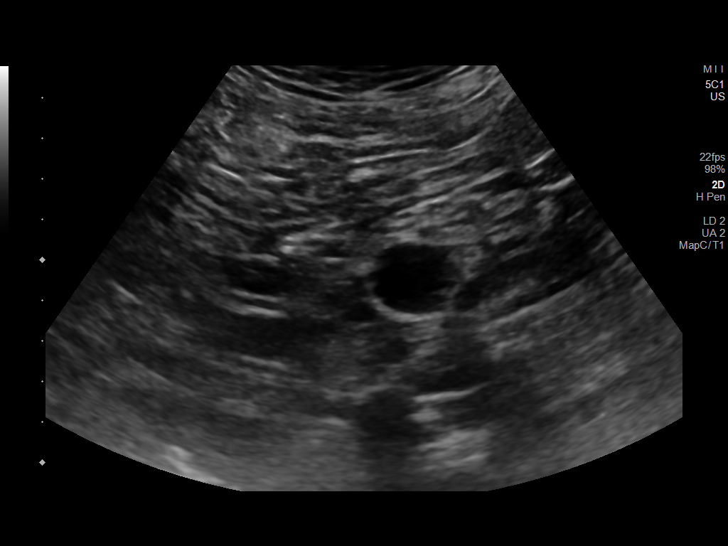

[14 of 25 positions shown; findings below may reference images not displayed]

FINDINGS: Abdominal aortic measurements as follows:

Proximal:  2.8 cm

Mid:  2.3 cm

Distal:  2.1 cm

Atherosclerotic plaque noted.
IMPRESSION: Negative for abdominal aortic aneurysm.

## 2021-03-17 ENCOUNTER — Ambulatory Visit (INDEPENDENT_AMBULATORY_CARE_PROVIDER_SITE_OTHER): Payer: PPO | Admitting: Family Medicine

## 2021-03-17 ENCOUNTER — Encounter: Payer: Self-pay | Admitting: Family Medicine

## 2021-03-17 ENCOUNTER — Other Ambulatory Visit: Payer: Self-pay

## 2021-03-17 VITALS — BP 120/68 | HR 62 | Temp 97.9°F | Ht 63.0 in | Wt 159.5 lb

## 2021-03-17 DIAGNOSIS — E119 Type 2 diabetes mellitus without complications: Secondary | ICD-10-CM | POA: Diagnosis not present

## 2021-03-17 DIAGNOSIS — Z23 Encounter for immunization: Secondary | ICD-10-CM

## 2021-03-17 DIAGNOSIS — Z0000 Encounter for general adult medical examination without abnormal findings: Secondary | ICD-10-CM

## 2021-03-17 LAB — LIPID PANEL
Cholesterol: 146 mg/dL (ref 0–200)
HDL: 35.7 mg/dL — ABNORMAL LOW (ref >39.00)
LDL Cholesterol: 71 mg/dL (ref 0–99)
NonHDL: 110.76
Total CHOL/HDL Ratio: 4
Triglycerides: 200 mg/dL — ABNORMAL HIGH (ref 0.0–149.0)
VLDL: 40 mg/dL (ref 0.0–40.0)

## 2021-03-17 LAB — COMPREHENSIVE METABOLIC PANEL
ALT: 19 U/L (ref 0–53)
AST: 17 U/L (ref 0–37)
Albumin: 4.8 g/dL (ref 3.5–5.2)
Alkaline Phosphatase: 34 U/L — ABNORMAL LOW (ref 39–117)
BUN: 27 mg/dL — ABNORMAL HIGH (ref 6–23)
CO2: 31 mEq/L (ref 19–32)
Calcium: 9.8 mg/dL (ref 8.4–10.5)
Chloride: 102 mEq/L (ref 96–112)
Creatinine, Ser: 1.27 mg/dL (ref 0.40–1.50)
GFR: 57.49 mL/min — ABNORMAL LOW (ref >60.00)
Glucose, Bld: 118 mg/dL — ABNORMAL HIGH (ref 70–99)
Potassium: 4 mEq/L (ref 3.5–5.1)
Sodium: 140 mEq/L (ref 135–145)
Total Bilirubin: 0.7 mg/dL (ref 0.2–1.2)
Total Protein: 6.9 g/dL (ref 6.0–8.3)

## 2021-03-17 LAB — CBC
HCT: 45.3 % (ref 39.0–52.0)
Hemoglobin: 15.4 g/dL (ref 13.0–17.0)
MCHC: 34 g/dL (ref 30.0–36.0)
MCV: 89 fl (ref 78.0–100.0)
Platelets: 192 10*3/uL (ref 150.0–400.0)
RBC: 5.08 Mil/uL (ref 4.22–5.81)
RDW: 13.1 % (ref 11.5–15.5)
WBC: 6.9 10*3/uL (ref 4.0–10.5)

## 2021-03-17 LAB — MICROALBUMIN / CREATININE URINE RATIO
Creatinine,U: 126.8 mg/dL
Microalb Creat Ratio: 0.6 mg/g (ref 0.0–30.0)
Microalb, Ur: <0.7 mg/dL (ref 0.0–1.9)

## 2021-03-17 LAB — HEMOGLOBIN A1C: Hgb A1c MFr Bld: 7.1 % — ABNORMAL HIGH (ref 4.6–6.5)

## 2021-03-17 MED ORDER — KETOCONAZOLE 2 % EX CREA: 1.0000 "application " | TOPICAL_CREAM | Freq: Every day | CUTANEOUS | 0 refills | Status: AC

## 2021-03-17 NOTE — Progress Notes (Signed)
Chief Complaint  Patient presents with   Annual Exam    Well Male Scott Davila is here for a complete physical.   His last physical was >1 year ago.  Current diet: in general, a "healthy" diet.   Current exercise: walking, lifting weights Weight trend: stable Fatigue out of ordinary? No. Seat belt? Yes.    Health maintenance Shingrix- Yes Colonoscopy- Yes Tetanus- Yes Hep C- Yes Pneumonia vaccine- Due  Past Medical History:  Diagnosis Date   Colon polyps    Diabetes mellitus without complication (HCC)    Heart murmur    Hyperlipidemia    Hypertension      Past Surgical History:  Procedure Laterality Date   OTHER SURGICAL HISTORY  1992   Maxillary surgery after accident; rod placement in femur    Medications  Current Outpatient Medications on File Prior to Visit  Medication Sig Dispense Refill   amLODipine-valsartan (EXFORGE) 5-320 MG tablet TAKE 1 TABLET BY MOUTH EVERY DAY 90 tablet 1   aspirin EC 81 MG tablet Take 81 mg by mouth daily.     B Complex Vitamins (B COMPLEX 1 PO) Take 1 tablet by mouth daily.     cholecalciferol (VITAMIN D) 400 units TABS tablet Take 400 Units by mouth daily.     co-enzyme Q-10 30 MG capsule Take 30 mg by mouth 3 (three) times daily.     fenofibrate 160 MG tablet TAKE 1 TABLET BY MOUTH EVERY DAY 90 tablet 1   hydrochlorothiazide (HYDRODIURIL) 25 MG tablet TAKE 1 TABLET BY MOUTH EVERY DAY 90 tablet 2   Magnesium 250 MG TABS Take 1 tablet by mouth daily.     metFORMIN (GLUCOPHAGE-XR) 500 MG 24 hr tablet Take 500 mg by mouth daily with breakfast.     metoprolol succinate (TOPROL-XL) 50 MG 24 hr tablet TAKE 1 TABLET BY MOUTH DAILY. TAKE WITH OR IMMEDIATELY FOLLOWING A MEAL. 90 tablet 1   Multiple Vitamins-Minerals (SENIOR MULTIVITAMIN PLUS PO) Take by mouth.     simvastatin (ZOCOR) 40 MG tablet TAKE 1 TABLET BY MOUTH EVERYDAY AT BEDTIME 90 tablet 1   vitamin C (ASCORBIC ACID) 500 MG tablet Take 500 mg by mouth daily.      Allergies Allergies  Allergen Reactions   Penicillins     Family History Family History  Problem Relation Age of Onset   Arthritis Mother    Hearing loss Mother    Hyperlipidemia Mother    Hypertension Mother    Dementia Mother    Dementia Father    Muscular dystrophy Father    COPD Sister    Alcohol abuse Brother    COPD Maternal Grandmother    Hearing loss Maternal Grandmother    Diabetes Maternal Grandfather    Heart disease Paternal Grandfather    Heart attack Paternal Grandfather     Review of Systems: Constitutional:  no fevers Eye:  no recent significant change in vision Ears:  No changes in hearing Nose/Mouth/Throat:  no complaints of nasal congestion, no sore throat Cardiovascular: no chest pain Respiratory:  No shortness of breath Gastrointestinal:  No change in bowel habits GU:  No frequency Integumentary:  no abnormal skin lesions reported Neurologic:  no headaches Endocrine:  denies unexplained weight changes  Exam BP 120/68   Pulse 62   Temp 97.9 F (36.6 C) (Oral)   Ht 5\' 3"  (1.6 m)   Wt 159 lb 8 oz (72.3 kg)   SpO2 93%   BMI 28.25 kg/m  General:  well developed, well nourished, in no apparent distress Skin:  no significant moles, warts, or growths Head:  no masses, lesions, or tenderness Eyes:  pupils equal and round, sclera anicteric without injection Ears:  canals without lesions, TMs shiny without retraction, no obvious effusion, no erythema Nose:  nares patent, septum midline, mucosa normal Throat/Pharynx:  lips and gingiva without lesion; tongue and uvula midline; non-inflamed pharynx; no exudates or postnasal drainage Lungs:  clear to auscultation, breath sounds equal bilaterally, no respiratory distress Cardio:  regular rate and rhythm, no LE edema or bruits Rectal: Deferred GI: BS+, S, NT, ND, no masses or organomegaly Musculoskeletal:  symmetrical muscle groups noted without atrophy or deformity Neuro:  gait normal; deep tendon  reflexes normal and symmetric Psych: well oriented with normal range of affect and appropriate judgment/insight  Assessment and Plan  Well adult exam  Diabetes mellitus type 2 in nonobese (HCC), Chronic - Plan: CBC, Comprehensive metabolic panel, Lipid panel, Hemoglobin A1c, Microalbumin / creatinine urine ratio   Well 70 y.o. male. Counseled on diet and exercise. Other orders as above. Follow up in 6 mo or prn.  The patient voiced understanding and agreement to the plan.  [name] [place], DO 03/17/21 10:02 AM

## 2021-03-17 NOTE — Addendum Note (Signed)
Addended by: Scharlene Gloss B on: 03/17/2021 10:08 AM   Modules accepted: Orders

## 2021-03-17 NOTE — Patient Instructions (Addendum)
I want your blood pressure <150/90.   Let me know if you need refills.   Keep an eye on the tremor. Let me know if it gets worse. Consider taking a video in the future as well.   Let us know if you need anything.

## 2021-03-20 ENCOUNTER — Other Ambulatory Visit: Payer: Self-pay | Admitting: Family Medicine

## 2021-03-20 DIAGNOSIS — E781 Pure hyperglyceridemia: Secondary | ICD-10-CM

## 2021-04-12 ENCOUNTER — Other Ambulatory Visit: Payer: Self-pay | Admitting: Family Medicine

## 2021-04-12 DIAGNOSIS — I10 Essential (primary) hypertension: Secondary | ICD-10-CM

## 2021-05-08 ENCOUNTER — Other Ambulatory Visit (INDEPENDENT_AMBULATORY_CARE_PROVIDER_SITE_OTHER): Payer: PPO

## 2021-05-08 ENCOUNTER — Other Ambulatory Visit: Payer: Self-pay | Admitting: Family Medicine

## 2021-05-08 ENCOUNTER — Other Ambulatory Visit: Payer: Self-pay

## 2021-05-08 DIAGNOSIS — E781 Pure hyperglyceridemia: Secondary | ICD-10-CM

## 2021-05-08 LAB — LIPID PANEL
Cholesterol: 141 mg/dL (ref 0–200)
HDL: 36 mg/dL — ABNORMAL LOW (ref >39.00)
LDL Cholesterol: 71 mg/dL (ref 0–99)
NonHDL: 104.67
Total CHOL/HDL Ratio: 4
Triglycerides: 170 mg/dL — ABNORMAL HIGH (ref 0.0–149.0)
VLDL: 34 mg/dL (ref 0.0–40.0)

## 2021-05-29 ENCOUNTER — Other Ambulatory Visit: Payer: Self-pay | Admitting: Family Medicine

## 2021-06-05 ENCOUNTER — Telehealth: Payer: Self-pay | Admitting: Family Medicine

## 2021-06-05 NOTE — Telephone Encounter (Signed)
Left message for patient to call back and schedule Medicare Annual Wellness Visit (AWV) in office.  ° °If not able to come in office, please offer to do virtually or by telephone.  Left office number and my jabber #[phone]. ° °Due for AWVI ° °Please schedule at anytime with Nurse Health Advisor. °  °

## 2021-06-12 DIAGNOSIS — H40023 Open angle with borderline findings, high risk, bilateral: Secondary | ICD-10-CM | POA: Diagnosis not present

## 2021-06-12 DIAGNOSIS — H2513 Age-related nuclear cataract, bilateral: Secondary | ICD-10-CM | POA: Diagnosis not present

## 2021-06-12 DIAGNOSIS — E119 Type 2 diabetes mellitus without complications: Secondary | ICD-10-CM | POA: Diagnosis not present

## 2021-06-25 ENCOUNTER — Other Ambulatory Visit: Payer: Self-pay | Admitting: Family Medicine

## 2021-06-25 DIAGNOSIS — I10 Essential (primary) hypertension: Secondary | ICD-10-CM

## 2021-06-26 MED ORDER — METFORMIN HCL ER 500 MG PO TB24: 500.0000 mg | ORAL_TABLET | Freq: Every day | ORAL | 1 refills | Status: DC

## 2021-07-04 ENCOUNTER — Encounter: Payer: Self-pay | Admitting: Medical

## 2021-07-04 ENCOUNTER — Telehealth (INDEPENDENT_AMBULATORY_CARE_PROVIDER_SITE_OTHER): Payer: PPO | Admitting: Medical

## 2021-07-04 ENCOUNTER — Other Ambulatory Visit: Payer: Self-pay

## 2021-07-04 VITALS — BP 81/66 | Temp 97.6°F | Resp 95

## 2021-07-04 DIAGNOSIS — U071 COVID-19: Secondary | ICD-10-CM

## 2021-07-04 MED ORDER — BENZONATATE 100 MG PO CAPS: 100.0000 mg | ORAL_CAPSULE | Freq: Three times a day (TID) | ORAL | 0 refills | Status: DC | PRN

## 2021-07-04 MED ORDER — FLUTICASONE PROPIONATE 50 MCG/ACT NA SUSP: 2.0000 | Freq: Every day | NASAL | 0 refills | Status: DC

## 2021-07-04 MED ORDER — MOLNUPIRAVIR EUA 200MG CAPSULE: 4.0000 | ORAL_CAPSULE | Freq: Two times a day (BID) | ORAL | 0 refills | Status: AC

## 2021-07-04 NOTE — Progress Notes (Signed)
   Subjective:    Patient ID: Scott Davila, male    DOB: 11-17-1950, 70 y.o.   MRN: [id number]  HPI  Virtual Visit via Video Note  I connected with Scott Davila on 07/04/21 at 11:20 AM EDT by a video enabled telemedicine application and verified that I am speaking with the correct person using two identifiers.  Location: Patient: home Provider: office   I discussed the limitations of evaluation and management by telemedicine and the availability of in person appointments. The patient expressed understanding and agreed to proceed.  History of Present Illness: Pt recently diagnosed with covid on Monday yesterday.  Test used- otc covid test on Monday.   History of covid vaccine x 2.   Covid risk score- 5  History of covid infection- 1 time this time.  Sunday onset felt weak, ha, ear ache, stuffy nose, dry cough with subjective fever. Today pt has mild/faint, slight dizzy today.  Pt describes gradually improving.  Pt 02 sat-does not have.       Observations/Objective:  General-no acute distress, pleasant, oriented. Lungs- on inspection lungs appear unlabored. Neck- no tracheal deviation or jvd on inspection. Neuro- gross motor function appears intact.    Assessment and Plan:  Patient Instructions  COVID infection with prior to vaccine history.  Risk score of 5.  Mild to moderate symptoms on Sunday but progressively improving with the milder side symptoms presently.  Asked to get O2 sat monitor and check O2 sats daily.  If O2 sats less than 96% let us know.   Prescribed Flonase for nasal congestion making benzonatate available for cough. Prescribed molnupiravir over paxlovid since patient is on statin and has decreased GFR.  If signs and symptoms worsen or change let us know.  If severe signs symptoms recommend ED evaluation.  Follow-up in 7 days or sooner if needed.  Follow Up Instructions:    I discussed the assessment and treatment plan with the patient. The  patient was provided an opportunity to ask questions and all were answered. The patient agreed with the plan and demonstrated an understanding of the instructions.   The patient was advised to call back or seek an in-person evaluation if the symptoms worsen or if the condition fails to improve as anticipated.  I provided 23 minutes of non-face-to-face time during this encounter.   Esperanza Richters, PA-C   Review of Systems     Objective:   Physical Exam        Assessment & Plan:

## 2021-07-04 NOTE — Patient Instructions (Addendum)
COVID infection with prior to vaccine history.  Risk score of 5.  Mild to moderate symptoms on Sunday but progressively improving with the milder side symptoms presently.  Asked to get O2 sat monitor and check O2 sats daily.  If O2 sats less than 96% let us know.   Prescribed Flonase for nasal congestion making benzonatate available for cough. Prescribed molnupiravir over paxlovid since patient is on statin and has decreased GFR.  If signs and symptoms worsen or change let us know.  If severe signs symptoms recommend ED evaluation.  Follow CDC guidelines regarding ending quarantine.  Typically can end quarantine 5 days post positive test provided if  you do not have a fever.  Then wear mask for additional 5 days.  Follow-up in 7 days or sooner if needed.

## 2021-07-12 ENCOUNTER — Other Ambulatory Visit: Payer: Self-pay | Admitting: Family Medicine

## 2021-07-12 ENCOUNTER — Other Ambulatory Visit (INDEPENDENT_AMBULATORY_CARE_PROVIDER_SITE_OTHER): Payer: PPO

## 2021-07-12 ENCOUNTER — Other Ambulatory Visit: Payer: Self-pay

## 2021-07-12 DIAGNOSIS — E781 Pure hyperglyceridemia: Secondary | ICD-10-CM

## 2021-07-12 LAB — LIPID PANEL
Cholesterol: 139 mg/dL (ref 0–200)
HDL: 31.4 mg/dL — ABNORMAL LOW (ref >39.00)
LDL Cholesterol: 68 mg/dL (ref 0–99)
NonHDL: 107.95
Total CHOL/HDL Ratio: 4
Triglycerides: 198 mg/dL — ABNORMAL HIGH (ref 0.0–149.0)
VLDL: 39.6 mg/dL (ref 0.0–40.0)

## 2021-07-12 MED ORDER — EZETIMIBE 10 MG PO TABS: 10.0000 mg | ORAL_TABLET | Freq: Every day | ORAL | 3 refills | Status: DC

## 2021-07-26 ENCOUNTER — Other Ambulatory Visit: Payer: Self-pay | Admitting: Medical

## 2021-08-25 ENCOUNTER — Other Ambulatory Visit (INDEPENDENT_AMBULATORY_CARE_PROVIDER_SITE_OTHER): Payer: PPO

## 2021-08-25 DIAGNOSIS — E781 Pure hyperglyceridemia: Secondary | ICD-10-CM

## 2021-08-25 LAB — LIPID PANEL
Cholesterol: 109 mg/dL (ref 0–200)
HDL: 41.4 mg/dL (ref >39.00)
LDL Cholesterol: 46 mg/dL (ref 0–99)
NonHDL: 67.17
Total CHOL/HDL Ratio: 3
Triglycerides: 105 mg/dL (ref 0.0–149.0)
VLDL: 21 mg/dL (ref 0.0–40.0)

## 2021-09-29 ENCOUNTER — Encounter: Payer: Self-pay | Admitting: Family Medicine

## 2021-09-29 ENCOUNTER — Ambulatory Visit (INDEPENDENT_AMBULATORY_CARE_PROVIDER_SITE_OTHER): Payer: PPO | Admitting: Family Medicine

## 2021-09-29 VITALS — BP 116/70 | HR 73 | Temp 97.7°F | Ht 63.0 in | Wt 156.2 lb

## 2021-09-29 DIAGNOSIS — E781 Pure hyperglyceridemia: Secondary | ICD-10-CM

## 2021-09-29 DIAGNOSIS — E119 Type 2 diabetes mellitus without complications: Secondary | ICD-10-CM

## 2021-09-29 DIAGNOSIS — I10 Essential (primary) hypertension: Secondary | ICD-10-CM

## 2021-09-29 NOTE — Progress Notes (Signed)
Subjective:   Chief Complaint  Patient presents with   Follow-up    6 month    Scott Davila is a 71 y.o. male here for follow-up of diabetes.  Here w spouse.  Scott Davila does not routinely monitor his sugars anymore.  Patient does not require insulin.   Medications include: metformin XR 500 mg/d Diet is healthy.  Exercise: walking, wt resistance exercise  Hypertension Patient presents for hypertension follow up. He does not monitor home blood pressures. He is compliant with medications- HCTZ 25 mg/d, Exforge 5-320 mg/d. Patient has these side effects of medication: none Diet/exercise as above. No Cp or SOB.   Hyperlipidemia Patient presents for mixed hyperlipidemia follow up. Currently being treated with Zocor 40 mg/d, Zetia 10 mg/d fenofibrate 160 mg/d and compliance with treatment thus far has been good. He complains of myalgias. Diet/exercise as above.  The patient is not known to have coexisting coronary artery disease.  Past Medical History:  Diagnosis Date   Colon polyps    Diabetes mellitus without complication (HCC)    Heart murmur    Hyperlipidemia    Hypertension      Related testing: Retinal exam: Done Pneumovax: done  Objective:  BP 116/70    Pulse 73    Temp 97.7 F (36.5 C) (Oral)    Ht 5\' 3"  (1.6 m)    Wt 156 lb 4 oz (70.9 kg)    SpO2 98%    BMI 27.68 kg/m  General:  Well developed, well nourished, in no apparent distress Skin:  Warm, no pallor or diaphoresis Lungs:  CTAB, no access msc use Cardio:  RRR, no bruits, no LE edema Psych: Age appropriate judgment and insight  Assessment:   Diabetes mellitus type 2 in nonobese (HCC) - Plan: Comprehensive metabolic panel, Lipid panel, Hemoglobin A1c  Essential hypertension  Hypertriglyceridemia   Plan:   Chronic, stable. Cont Metformin XR 500 mg/d. Counseled on diet and exercise. Chronic, stable. Cont HCTZ 25 mg/d, Exforge 5-320 mg/d.  Chronic, stable. Cont Zetia 10 mg/d, Fenofibrate 160 mg/d,  Zocor 40 mg/d. F/u in 6 mo. The patient voiced understanding and agreement to the plan.  [place], DO 09/29/21 9:52 AM

## 2021-09-29 NOTE — Patient Instructions (Signed)
Give us 2-3 business days to get the results of your labs back.   Keep the diet clean and stay active.  Let us know if you need anything. 

## 2021-10-09 ENCOUNTER — Other Ambulatory Visit: Payer: Self-pay | Admitting: Family Medicine

## 2021-10-09 DIAGNOSIS — I10 Essential (primary) hypertension: Secondary | ICD-10-CM

## 2021-10-10 ENCOUNTER — Other Ambulatory Visit: Payer: PPO

## 2021-10-11 ENCOUNTER — Other Ambulatory Visit (INDEPENDENT_AMBULATORY_CARE_PROVIDER_SITE_OTHER): Payer: PPO

## 2021-10-11 DIAGNOSIS — E119 Type 2 diabetes mellitus without complications: Secondary | ICD-10-CM | POA: Diagnosis not present

## 2021-10-11 LAB — LIPID PANEL
Cholesterol: 110 mg/dL (ref 0–200)
HDL: 43.5 mg/dL (ref >39.00)
LDL Cholesterol: 44 mg/dL (ref 0–99)
NonHDL: 66.44
Total CHOL/HDL Ratio: 3
Triglycerides: 110 mg/dL (ref 0.0–149.0)
VLDL: 22 mg/dL (ref 0.0–40.0)

## 2021-10-11 LAB — COMPREHENSIVE METABOLIC PANEL
ALT: 18 U/L (ref 0–53)
AST: 16 U/L (ref 0–37)
Albumin: 4.7 g/dL (ref 3.5–5.2)
Alkaline Phosphatase: 34 U/L — ABNORMAL LOW (ref 39–117)
BUN: 20 mg/dL (ref 6–23)
CO2: 30 mEq/L (ref 19–32)
Calcium: 10 mg/dL (ref 8.4–10.5)
Chloride: 102 mEq/L (ref 96–112)
Creatinine, Ser: 1.13 mg/dL (ref 0.40–1.50)
GFR: 65.88 mL/min (ref >60.00)
Glucose, Bld: 114 mg/dL — ABNORMAL HIGH (ref 70–99)
Potassium: 4.2 mEq/L (ref 3.5–5.1)
Sodium: 141 mEq/L (ref 135–145)
Total Bilirubin: 0.7 mg/dL (ref 0.2–1.2)
Total Protein: 6.9 g/dL (ref 6.0–8.3)

## 2021-10-11 LAB — HEMOGLOBIN A1C: Hgb A1c MFr Bld: 6.8 % — ABNORMAL HIGH (ref 4.6–6.5)

## 2021-11-23 ENCOUNTER — Other Ambulatory Visit: Payer: Self-pay | Admitting: Family Medicine

## 2021-12-11 DIAGNOSIS — H2513 Age-related nuclear cataract, bilateral: Secondary | ICD-10-CM | POA: Diagnosis not present

## 2021-12-11 DIAGNOSIS — H5203 Hypermetropia, bilateral: Secondary | ICD-10-CM | POA: Diagnosis not present

## 2021-12-11 DIAGNOSIS — E119 Type 2 diabetes mellitus without complications: Secondary | ICD-10-CM | POA: Diagnosis not present

## 2021-12-11 DIAGNOSIS — H40023 Open angle with borderline findings, high risk, bilateral: Secondary | ICD-10-CM | POA: Diagnosis not present

## 2021-12-11 DIAGNOSIS — H0102A Squamous blepharitis right eye, upper and lower eyelids: Secondary | ICD-10-CM | POA: Diagnosis not present

## 2021-12-22 ENCOUNTER — Other Ambulatory Visit: Payer: Self-pay | Admitting: Family Medicine

## 2022-03-16 ENCOUNTER — Other Ambulatory Visit: Payer: Self-pay | Admitting: Family Medicine

## 2022-03-16 DIAGNOSIS — I10 Essential (primary) hypertension: Secondary | ICD-10-CM

## 2022-03-20 ENCOUNTER — Encounter: Payer: PPO | Admitting: Family Medicine

## 2022-03-26 ENCOUNTER — Ambulatory Visit (INDEPENDENT_AMBULATORY_CARE_PROVIDER_SITE_OTHER): Payer: PPO | Admitting: Family Medicine

## 2022-03-26 ENCOUNTER — Encounter: Payer: Self-pay | Admitting: Family Medicine

## 2022-03-26 VITALS — BP 120/72 | HR 72 | Temp 98.0°F | Ht 62.0 in | Wt 154.1 lb

## 2022-03-26 DIAGNOSIS — Z0000 Encounter for general adult medical examination without abnormal findings: Secondary | ICD-10-CM | POA: Diagnosis not present

## 2022-03-26 DIAGNOSIS — E119 Type 2 diabetes mellitus without complications: Secondary | ICD-10-CM

## 2022-03-26 LAB — COMPREHENSIVE METABOLIC PANEL
ALT: 21 U/L (ref 0–53)
AST: 20 U/L (ref 0–37)
Albumin: 5 g/dL (ref 3.5–5.2)
Alkaline Phosphatase: 37 U/L — ABNORMAL LOW (ref 39–117)
BUN: 24 mg/dL — ABNORMAL HIGH (ref 6–23)
CO2: 30 mEq/L (ref 19–32)
Calcium: 10.6 mg/dL — ABNORMAL HIGH (ref 8.4–10.5)
Chloride: 99 mEq/L (ref 96–112)
Creatinine, Ser: 1.18 mg/dL (ref 0.40–1.50)
GFR: 62.34 mL/min (ref >60.00)
Glucose, Bld: 148 mg/dL — ABNORMAL HIGH (ref 70–99)
Potassium: 4 mEq/L (ref 3.5–5.1)
Sodium: 140 mEq/L (ref 135–145)
Total Bilirubin: 0.7 mg/dL (ref 0.2–1.2)
Total Protein: 7.1 g/dL (ref 6.0–8.3)

## 2022-03-26 LAB — MICROALBUMIN / CREATININE URINE RATIO
Creatinine,U: 66.9 mg/dL
Microalb Creat Ratio: 1 mg/g (ref 0.0–30.0)
Microalb, Ur: <0.7 mg/dL (ref 0.0–1.9)

## 2022-03-26 LAB — LIPID PANEL
Cholesterol: 118 mg/dL (ref 0–200)
HDL: 43 mg/dL (ref >39.00)
NonHDL: 74.99
Total CHOL/HDL Ratio: 3
Triglycerides: 244 mg/dL — ABNORMAL HIGH (ref 0.0–149.0)
VLDL: 48.8 mg/dL — ABNORMAL HIGH (ref 0.0–40.0)

## 2022-03-26 LAB — CBC
HCT: 45.9 % (ref 39.0–52.0)
Hemoglobin: 15.3 g/dL (ref 13.0–17.0)
MCHC: 33.4 g/dL (ref 30.0–36.0)
MCV: 90.4 fl (ref 78.0–100.0)
Platelets: 186 10*3/uL (ref 150.0–400.0)
RBC: 5.09 Mil/uL (ref 4.22–5.81)
RDW: 13.2 % (ref 11.5–15.5)
WBC: 7 10*3/uL (ref 4.0–10.5)

## 2022-03-26 LAB — HEMOGLOBIN A1C: Hgb A1c MFr Bld: 7.3 % — ABNORMAL HIGH (ref 4.6–6.5)

## 2022-03-26 LAB — LDL CHOLESTEROL, DIRECT: Direct LDL: 60 mg/dL

## 2022-03-26 NOTE — Patient Instructions (Signed)
Give us 2-3 business days to get the results of your labs back.   Keep the diet clean and stay active.  Please get me a copy of your advanced directive form at your convenience.   Let us know if you need anything.  

## 2022-03-26 NOTE — Progress Notes (Signed)
Chief Complaint  Patient presents with   Annual Exam    Well Male Scott Davila is here for a complete physical.   His last physical was >1 year ago.  Current diet: in general, a "healthy" diet.   Current exercise: walks, lifts weights Weight trend: stable Fatigue out of ordinary? No. Seat belt? Yes.   Advanced directive? No  Health maintenance Shingrix- Yes Colonoscopy- Yes Tetanus- Yes Hep C- Yes Pneumonia vaccine- Yes AAA screening- Yes  Past Medical History:  Diagnosis Date   Colon polyps    Diabetes mellitus without complication (HCC)    Heart murmur    Hyperlipidemia    Hypertension      Past Surgical History:  Procedure Laterality Date   OTHER SURGICAL HISTORY  1992   Maxillary surgery after accident; rod placement in femur    Medications  Current Outpatient Medications on File Prior to Visit  Medication Sig Dispense Refill   amLODipine-valsartan (EXFORGE) 5-320 MG tablet TAKE 1 TABLET BY MOUTH EVERY DAY 90 tablet 1   aspirin EC 81 MG tablet Take 81 mg by mouth daily.     B Complex Vitamins (B COMPLEX 1 PO) Take 1 tablet by mouth daily.     cholecalciferol (VITAMIN D) 400 units TABS tablet Take 400 Units by mouth daily.     co-enzyme Q-10 30 MG capsule Take 30 mg by mouth 3 (three) times daily.     ezetimibe (ZETIA) 10 MG tablet Take 1 tablet (10 mg total) by mouth daily. 90 tablet 3   fenofibrate 160 MG tablet TAKE 1 TABLET BY MOUTH EVERY DAY 90 tablet 1   hydrochlorothiazide (HYDRODIURIL) 25 MG tablet TAKE 1 TABLET BY MOUTH EVERY DAY 90 tablet 2   Magnesium 250 MG TABS Take 1 tablet by mouth daily.     metFORMIN (GLUCOPHAGE-XR) 500 MG 24 hr tablet TAKE 1 TABLET BY MOUTH EVERY DAY WITH BREAKFAST 90 tablet 1   Multiple Vitamins-Minerals (SENIOR MULTIVITAMIN PLUS PO) Take by mouth.     simvastatin (ZOCOR) 40 MG tablet TAKE 1 TABLET BY MOUTH EVERYDAY AT BEDTIME 90 tablet 1   vitamin C (ASCORBIC ACID) 500 MG tablet Take 500 mg by mouth daily.      fluticasone (FLONASE) 50 MCG/ACT nasal spray SPRAY 2 SPRAYS INTO EACH NOSTRIL EVERY DAY 16 mL 2   Allergies No Active Allergies   Family History Family History  Problem Relation Age of Onset   Arthritis Mother    Hearing loss Mother    Hyperlipidemia Mother    Hypertension Mother    Dementia Mother    Dementia Father    Muscular dystrophy Father    COPD Sister    Alcohol abuse Brother    COPD Maternal Grandmother    Hearing loss Maternal Grandmother    Diabetes Maternal Grandfather    Heart disease Paternal Grandfather    Heart attack Paternal Grandfather     Review of Systems: Constitutional:  no fevers Eye:  no recent significant change in vision Ears:  No changes in hearing Nose/Mouth/Throat:  no complaints of nasal congestion, no sore throat Cardiovascular: no chest pain Respiratory:  No shortness of breath Gastrointestinal:  No change in bowel habits GU:  No frequency Integumentary:  no abnormal skin lesions reported Neurologic:  no headaches Endocrine:  denies unexplained weight changes  Exam BP 120/72   Pulse 72   Temp 98 F (36.7 C) (Oral)   Ht 5\' 2"  (1.575 m)   Wt 154 lb 2 oz (  69.9 kg)   SpO2 96%   BMI 28.19 kg/m  General:  well developed, well nourished, in no apparent distress Skin:  no significant moles, warts, or growths on feet or exposed surfaces Head:  no masses, lesions, or tenderness Eyes:  pupils equal and round, sclera anicteric without injection Ears:  canals without lesions, TMs shiny without retraction, no obvious effusion, no erythema Nose:  nares patent, septum midline, mucosa normal Throat/Pharynx:  lips and gingiva without lesion; tongue and uvula midline; non-inflamed pharynx; no exudates or postnasal drainage Lungs:  clear to auscultation, breath sounds equal bilaterally, no respiratory distress Cardio:  regular rate and rhythm, no LE edema or bruits Rectal: Deferred GI: BS+, S, NT, ND, no masses or organomegaly Musculoskeletal:   symmetrical muscle groups noted without atrophy or deformity Neuro:  gait normal; deep tendon reflexes normal and symmetric; sensation intact to pinprick b/l feet Psych: well oriented with normal range of affect and appropriate judgment/insight  Assessment and Plan  Well adult exam  Diabetes mellitus type 2 in nonobese (HCC) - Plan: CBC, Comprehensive metabolic panel, Hemoglobin A1c, Lipid panel, Microalbumin / creatinine urine ratio   Well 71 y.o. male. Counseled on diet and exercise. Advanced directive form provided today.  Other orders as above. Follow up in 6 mo or prn.  The patient voiced understanding and agreement to the plan.  [name] [place], DO 03/26/22 10:29 AM

## 2022-03-28 ENCOUNTER — Other Ambulatory Visit: Payer: Self-pay | Admitting: Family Medicine

## 2022-03-28 ENCOUNTER — Encounter: Payer: Self-pay | Admitting: Family Medicine

## 2022-03-28 DIAGNOSIS — E781 Pure hyperglyceridemia: Secondary | ICD-10-CM

## 2022-04-02 ENCOUNTER — Other Ambulatory Visit: Payer: Self-pay | Admitting: Family Medicine

## 2022-04-02 DIAGNOSIS — I10 Essential (primary) hypertension: Secondary | ICD-10-CM

## 2022-04-27 ENCOUNTER — Other Ambulatory Visit (INDEPENDENT_AMBULATORY_CARE_PROVIDER_SITE_OTHER): Payer: PPO

## 2022-04-27 DIAGNOSIS — E781 Pure hyperglyceridemia: Secondary | ICD-10-CM | POA: Diagnosis not present

## 2022-04-27 LAB — LIPID PANEL
Cholesterol: 106 mg/dL (ref 0–200)
HDL: 40.3 mg/dL (ref >39.00)
LDL Cholesterol: 45 mg/dL (ref 0–99)
NonHDL: 66.16
Total CHOL/HDL Ratio: 3
Triglycerides: 104 mg/dL (ref 0.0–149.0)
VLDL: 20.8 mg/dL (ref 0.0–40.0)

## 2022-05-20 ENCOUNTER — Other Ambulatory Visit: Payer: Self-pay | Admitting: Family Medicine

## 2022-06-18 DIAGNOSIS — H2513 Age-related nuclear cataract, bilateral: Secondary | ICD-10-CM | POA: Diagnosis not present

## 2022-06-18 DIAGNOSIS — E119 Type 2 diabetes mellitus without complications: Secondary | ICD-10-CM | POA: Diagnosis not present

## 2022-06-18 DIAGNOSIS — H40023 Open angle with borderline findings, high risk, bilateral: Secondary | ICD-10-CM | POA: Diagnosis not present

## 2022-06-22 ENCOUNTER — Other Ambulatory Visit: Payer: Self-pay | Admitting: Medical

## 2022-06-24 ENCOUNTER — Other Ambulatory Visit: Payer: Self-pay | Admitting: Family Medicine

## 2022-09-22 ENCOUNTER — Other Ambulatory Visit: Payer: Self-pay | Admitting: Family Medicine

## 2022-09-22 DIAGNOSIS — I10 Essential (primary) hypertension: Secondary | ICD-10-CM

## 2022-10-01 ENCOUNTER — Ambulatory Visit (INDEPENDENT_AMBULATORY_CARE_PROVIDER_SITE_OTHER): Payer: PPO | Admitting: Family Medicine

## 2022-10-01 ENCOUNTER — Encounter: Payer: Self-pay | Admitting: Family Medicine

## 2022-10-01 VITALS — BP 130/80 | HR 88 | Temp 97.9°F | Resp 16 | Ht 63.0 in | Wt 165.4 lb

## 2022-10-01 DIAGNOSIS — I10 Essential (primary) hypertension: Secondary | ICD-10-CM | POA: Diagnosis not present

## 2022-10-01 DIAGNOSIS — E781 Pure hyperglyceridemia: Secondary | ICD-10-CM

## 2022-10-01 DIAGNOSIS — E119 Type 2 diabetes mellitus without complications: Secondary | ICD-10-CM | POA: Diagnosis not present

## 2022-10-01 LAB — COMPREHENSIVE METABOLIC PANEL
ALT: 24 U/L (ref 0–53)
AST: 17 U/L (ref 0–37)
Albumin: 4.7 g/dL (ref 3.5–5.2)
Alkaline Phosphatase: 43 U/L (ref 39–117)
BUN: 26 mg/dL — ABNORMAL HIGH (ref 6–23)
CO2: 30 mEq/L (ref 19–32)
Calcium: 10.1 mg/dL (ref 8.4–10.5)
Chloride: 98 mEq/L (ref 96–112)
Creatinine, Ser: 1.1 mg/dL (ref 0.40–1.50)
GFR: 67.58 mL/min (ref >60.00)
Glucose, Bld: 343 mg/dL — ABNORMAL HIGH (ref 70–99)
Potassium: 4 mEq/L (ref 3.5–5.1)
Sodium: 136 mEq/L (ref 135–145)
Total Bilirubin: 0.7 mg/dL (ref 0.2–1.2)
Total Protein: 6.9 g/dL (ref 6.0–8.3)

## 2022-10-01 LAB — LIPID PANEL
Cholesterol: 102 mg/dL (ref 0–200)
HDL: 36.1 mg/dL — ABNORMAL LOW (ref >39.00)
NonHDL: 65.71
Total CHOL/HDL Ratio: 3
Triglycerides: 296 mg/dL — ABNORMAL HIGH (ref 0.0–149.0)
VLDL: 59.2 mg/dL — ABNORMAL HIGH (ref 0.0–40.0)

## 2022-10-01 LAB — LDL CHOLESTEROL, DIRECT: Direct LDL: 46 mg/dL

## 2022-10-01 LAB — HEMOGLOBIN A1C: Hgb A1c MFr Bld: 9.7 % — ABNORMAL HIGH (ref 4.6–6.5)

## 2022-10-01 NOTE — Progress Notes (Signed)
Subjective:   Chief Complaint  Patient presents with   Follow-up    Scott Davila is a 72 y.o. male here for follow-up of diabetes.  He is here with his spouse. Loretta does not routinely monitor his sugars.  Patient does not require insulin.   Medications include: Metformin XR 500 mg/d Diet is healthy.  Exercise: walking  Hypertension Patient presents for hypertension follow up. He does not monitor home blood pressures. He is compliant with medications-HCTZ 25 mg/d, Exforge 5-320 mg/d. Patient has these side effects of medication: none Diet/exercise as above. No Cp or SOB.   Hyperlipidemia Patient presents for dyslipidemia follow up. Currently being treated with Fenofibrate 160 mg/d, Zocor 40 mg daily, Zetia 10 mg daily and compliance with treatment thus far has been good. He denies myalgias. The patient is not known to have coexisting coronary artery disease.  Past Medical History:  Diagnosis Date   Colon polyps    Diabetes mellitus without complication (HCC)    Heart murmur    Hyperlipidemia    Hypertension      Related testing: Retinal exam: Done Pneumovax: done  Objective:  BP 130/80   Pulse 88   Temp 97.9 F (36.6 C)   Resp 16   Ht 5\' 3"  (1.6 m)   Wt 165 lb 6.4 oz (75 kg)   SpO2 92%   BMI 29.30 kg/m  General:  Well developed, well nourished, in no apparent distress Skin:  Warm, no pallor or diaphoresis Head:  Normocephalic, atraumatic Eyes:  Pupils equal and round, sclera anicteric without injection  Lungs:  CTAB, no access msc use Cardio:  RRR, no bruits, no LE edema Psych: Normal affect and mood  Assessment:   Diabetes mellitus type 2 in nonobese (HCC) - Plan: Comprehensive metabolic panel, Lipid panel, Hemoglobin A1c  Essential hypertension  Hypertriglyceridemia   Plan:   Chronic, stable.  Continue metformin XR 500 mg daily.  Counseled on diet and exercise. Chronic, stable.  Continue hydrochlorothiazide 25 mg daily, Exforge 5-320 mg  daily.   Chronic, stable.  Continue fenofibrate 160 mg daily, Zetia 10 mg daily, Zocor 40 mg daily. F/u in 6 mo. The patient and his spouse voiced understanding and agreement to the plan.  [place], DO 10/01/22 11:17 AM

## 2022-10-01 NOTE — Patient Instructions (Addendum)
Give [place] 2-3 business days to get the results of your labs back.   Keep the diet clean and stay active.  Add some weight resistance exercising to your routine.   Send me a MyChart message if you want to see physical therapy.   Let us know if you need anything.

## 2022-10-02 ENCOUNTER — Other Ambulatory Visit: Payer: Self-pay | Admitting: Family Medicine

## 2022-10-02 ENCOUNTER — Encounter: Payer: Self-pay | Admitting: Family Medicine

## 2022-10-02 MED ORDER — METFORMIN HCL ER 500 MG PO TB24: ORAL_TABLET | ORAL | 1 refills | Status: DC

## 2022-10-05 ENCOUNTER — Other Ambulatory Visit: Payer: Self-pay

## 2022-10-05 ENCOUNTER — Encounter: Payer: Self-pay | Admitting: Family Medicine

## 2022-10-05 MED ORDER — ONETOUCH VERIO W/DEVICE KIT: PACK | 0 refills | Status: AC

## 2022-10-05 MED ORDER — ONETOUCH ULTRASOFT LANCETS MISC: 12 refills | Status: DC

## 2022-10-05 MED ORDER — ONETOUCH VERIO VI STRP: ORAL_STRIP | 12 refills | Status: AC

## 2022-11-02 ENCOUNTER — Encounter: Payer: Self-pay | Admitting: Family Medicine

## 2022-11-02 ENCOUNTER — Ambulatory Visit (INDEPENDENT_AMBULATORY_CARE_PROVIDER_SITE_OTHER): Payer: PPO | Admitting: Family Medicine

## 2022-11-02 ENCOUNTER — Other Ambulatory Visit: Payer: Self-pay | Admitting: Family Medicine

## 2022-11-02 VITALS — BP 114/68 | HR 75 | Temp 97.7°F | Ht 63.5 in | Wt 160.5 lb

## 2022-11-02 DIAGNOSIS — E1165 Type 2 diabetes mellitus with hyperglycemia: Secondary | ICD-10-CM | POA: Diagnosis not present

## 2022-11-02 DIAGNOSIS — E119 Type 2 diabetes mellitus without complications: Secondary | ICD-10-CM

## 2022-11-02 MED ORDER — METFORMIN HCL ER 500 MG PO TB24: ORAL_TABLET | ORAL | 1 refills | Status: DC

## 2022-11-02 NOTE — Progress Notes (Signed)
Chief Complaint  Patient presents with   Follow-up    DM    Subjective: Patient is a 72 y.o. male here for f/u sugars.  He is here with his wife who helps with the history.  A1c had increased to 9.7. Diet sig improved since then, walking for exercise. No CP or SOB. No low's. Sugars running in the mid-high 100s.  Significantly proved from the 300s when his diet was poor. He is compliant with his metformin XR taking 500 mg twice daily.  No adverse effects.  Past Medical History:  Diagnosis Date   Colon polyps    Diabetes mellitus without complication (HCC)    Heart murmur    Hyperlipidemia    Hypertension     Objective: BP 114/68 (BP Location: Left Arm, Patient Position: Sitting, Cuff Size: Normal)   Pulse 75   Temp 97.7 F (36.5 C) (Oral)   Ht 5' 3.5" (1.613 m)   Wt 160 lb 8 oz (72.8 kg)   SpO2 97%   BMI 27.99 kg/m  General: Awake, appears stated age Heart: RRR, no LE edema Lungs: CTAB, no rales, wheezes or rhonchi. No accessory muscle use Psych: Age appropriate judgment and insight, normal affect and mood  Assessment and Plan: Diabetes mellitus type 2 in nonobese ([place]) - Plan: metFORMIN (GLUCOPHAGE-XR) 500 MG 24 hr tablet  Chronic, uncontrolled.  Commended him for improving his diet.  Increase metformin from 500 mg twice daily to 1000 mg twice daily.  Monitor blood sugar at home.  Counseled on diet and exercise.  Follow-up in 2 months to recheck. The patient and his spouse voiced understanding and agreement to the plan.  [place], DO 11/02/22  9:44 AM

## 2022-11-02 NOTE — Patient Instructions (Signed)
Keep the diet clean and stay active.  Strong work with improving your diet.   Keep checking your sugars intermittently.   Let us know if you need anything.

## 2022-12-02 ENCOUNTER — Other Ambulatory Visit: Payer: Self-pay | Admitting: Family Medicine

## 2022-12-02 DIAGNOSIS — I10 Essential (primary) hypertension: Secondary | ICD-10-CM

## 2022-12-13 ENCOUNTER — Other Ambulatory Visit: Payer: Self-pay | Admitting: Family Medicine

## 2022-12-24 DIAGNOSIS — H524 Presbyopia: Secondary | ICD-10-CM | POA: Diagnosis not present

## 2022-12-24 DIAGNOSIS — H2513 Age-related nuclear cataract, bilateral: Secondary | ICD-10-CM | POA: Diagnosis not present

## 2022-12-24 DIAGNOSIS — H5203 Hypermetropia, bilateral: Secondary | ICD-10-CM | POA: Diagnosis not present

## 2022-12-24 DIAGNOSIS — H02051 Trichiasis without entropian right upper eyelid: Secondary | ICD-10-CM | POA: Diagnosis not present

## 2022-12-24 DIAGNOSIS — H40023 Open angle with borderline findings, high risk, bilateral: Secondary | ICD-10-CM | POA: Diagnosis not present

## 2022-12-24 DIAGNOSIS — E119 Type 2 diabetes mellitus without complications: Secondary | ICD-10-CM | POA: Diagnosis not present

## 2023-01-01 ENCOUNTER — Encounter: Payer: Self-pay | Admitting: Family Medicine

## 2023-01-01 ENCOUNTER — Ambulatory Visit (INDEPENDENT_AMBULATORY_CARE_PROVIDER_SITE_OTHER): Payer: PPO | Admitting: Family Medicine

## 2023-01-01 VITALS — BP 128/83 | HR 67 | Temp 97.7°F | Ht 63.0 in | Wt 158.2 lb

## 2023-01-01 DIAGNOSIS — E119 Type 2 diabetes mellitus without complications: Secondary | ICD-10-CM | POA: Diagnosis not present

## 2023-01-01 DIAGNOSIS — E781 Pure hyperglyceridemia: Secondary | ICD-10-CM | POA: Diagnosis not present

## 2023-01-01 LAB — LIPID PANEL
Cholesterol: 87 mg/dL (ref 0–200)
HDL: 32.6 mg/dL — ABNORMAL LOW (ref >39.00)
LDL Cholesterol: 28 mg/dL (ref 0–99)
NonHDL: 54.49
Total CHOL/HDL Ratio: 3
Triglycerides: 130 mg/dL (ref 0.0–149.0)
VLDL: 26 mg/dL (ref 0.0–40.0)

## 2023-01-01 LAB — COMPREHENSIVE METABOLIC PANEL
ALT: 19 U/L (ref 0–53)
AST: 18 U/L (ref 0–37)
Albumin: 4.8 g/dL (ref 3.5–5.2)
Alkaline Phosphatase: 29 U/L — ABNORMAL LOW (ref 39–117)
BUN: 27 mg/dL — ABNORMAL HIGH (ref 6–23)
CO2: 29 mEq/L (ref 19–32)
Calcium: 9.7 mg/dL (ref 8.4–10.5)
Chloride: 102 mEq/L (ref 96–112)
Creatinine, Ser: 1.06 mg/dL (ref 0.40–1.50)
GFR: 70.53 mL/min (ref >60.00)
Glucose, Bld: 122 mg/dL — ABNORMAL HIGH (ref 70–99)
Potassium: 4 mEq/L (ref 3.5–5.1)
Sodium: 141 mEq/L (ref 135–145)
Total Bilirubin: 0.7 mg/dL (ref 0.2–1.2)
Total Protein: 6.5 g/dL (ref 6.0–8.3)

## 2023-01-01 LAB — HEMOGLOBIN A1C: Hgb A1c MFr Bld: 7.1 % — ABNORMAL HIGH (ref 4.6–6.5)

## 2023-01-01 NOTE — Progress Notes (Signed)
Subjective:   Chief Complaint  Patient presents with   Follow-up    2 month     Scott Davila is a 72 y.o. male here for follow-up of diabetes.  Here w spouse.  Scott Davila's self monitored glucose range is low-mid 100's fasting  Patient denies hypoglycemic reactions. He checks his glucose levels 3 time(s) per day. Patient does not require insulin.   Medications include: metformin XR 1000 mg bid. Diet is better.  Exercise: walking  Hyperlipidemia Patient presents for dyslipidemia follow up. Currently being treated with fenofibrate 160 mg/d, Zocor 40 mg/d, Zetia 10 mg/d and compliance with treatment thus far has been good. He denies myalgias. Diet/exercise as above.  The patient is not known to have coexisting coronary artery disease. No CP or SOB.   Past Medical History:  Diagnosis Date   Colon polyps    Diabetes mellitus without complication    Heart murmur    Hyperlipidemia    Hypertension      Related testing: Retinal exam: Done Pneumovax: done  Objective:  BP 128/83 (BP Location: Left Arm, Patient Position: Sitting, Cuff Size: Normal)   Pulse 67   Temp 97.7 F (36.5 C) (Oral)   Ht 5\' 3"  (1.6 m)   Wt 158 lb 4 oz (71.8 kg)   SpO2 96%   BMI 28.03 kg/m  General:  Well developed, well nourished, in no apparent distress Skin:  Warm, no pallor or diaphoresis on exposed skin Lungs:  CTAB, no access msc use Cardio:  RRR, no bruits, no LE edema Psych: Age appropriate judgment and insight  Assessment:   Diabetes mellitus type 2 in nonobese - Plan: Comprehensive metabolic panel, Lipid panel, Hemoglobin A1c  Hypertriglyceridemia   Plan:   Chronic, hopefully stable. Cont Metformin XR 1000 mg bid. Counseled on diet and exercise. Chronic, hopefully stable. Cont Zetia 10 mg/d, Zocor 40 mg/d, fenofibrate 160 mg/d.  F/u in 3-6 mo. The patient and his spouse voiced understanding and agreement to the plan.  [name] [place], DO 01/01/23 10:41 AM

## 2023-01-01 NOTE — Patient Instructions (Signed)
Give [place] 2-3 business days to get the results of your labs back.   Strong work cleaning up the diet.  Let us know if you need anything.

## 2023-02-28 ENCOUNTER — Other Ambulatory Visit: Payer: Self-pay | Admitting: Family Medicine

## 2023-03-18 ENCOUNTER — Other Ambulatory Visit: Payer: Self-pay | Admitting: Family Medicine

## 2023-03-18 DIAGNOSIS — I10 Essential (primary) hypertension: Secondary | ICD-10-CM

## 2023-04-02 ENCOUNTER — Encounter: Payer: Self-pay | Admitting: Family Medicine

## 2023-04-02 ENCOUNTER — Encounter: Payer: PPO | Admitting: Family Medicine

## 2023-04-02 ENCOUNTER — Ambulatory Visit: Payer: PPO | Admitting: Family Medicine

## 2023-04-02 VITALS — BP 120/78 | HR 89 | Temp 98.4°F | Ht 63.0 in | Wt 155.0 lb

## 2023-04-02 DIAGNOSIS — E119 Type 2 diabetes mellitus without complications: Secondary | ICD-10-CM | POA: Diagnosis not present

## 2023-04-02 DIAGNOSIS — Z0000 Encounter for general adult medical examination without abnormal findings: Secondary | ICD-10-CM | POA: Diagnosis not present

## 2023-04-02 DIAGNOSIS — L280 Lichen simplex chronicus: Secondary | ICD-10-CM | POA: Diagnosis not present

## 2023-04-02 DIAGNOSIS — D2261 Melanocytic nevi of right upper limb, including shoulder: Secondary | ICD-10-CM

## 2023-04-02 DIAGNOSIS — Z7984 Long term (current) use of oral hypoglycemic drugs: Secondary | ICD-10-CM | POA: Diagnosis not present

## 2023-04-02 DIAGNOSIS — D489 Neoplasm of uncertain behavior, unspecified: Secondary | ICD-10-CM

## 2023-04-02 NOTE — Addendum Note (Signed)
Addended by: Scharlene Gloss B on: 04/02/2023 11:16 AM   Modules accepted: Orders

## 2023-04-02 NOTE — Progress Notes (Addendum)
Chief Complaint  Patient presents with   Annual Exam    Well Male Tone Scott Davila is here for a complete physical.   His last physical was >1 year ago.  Current diet: in general, a "healthy" diet.   Current exercise: calisthenics, strength training, walking Weight trend: slightly decreasing 2/2 improved diet Fatigue out of ordinary? No. Seat belt? Yes.   Advanced directive? Yes  Health maintenance Shingrix- Yes Colonoscopy- Yes Tetanus- Yes Hep C- Yes Pneumonia vaccine- Yes  Skin lesion: Present on the right forearm for the past 6 months.  No significant change.  It will scale and sometimes bleed but he does pick at it relatively frequently.  There is no other drainage, pain, itching, redness.  No new lotions, soaps, topicals, or detergents.  He does have a history of not wearing sunscreen.  Past Medical History:  Diagnosis Date   Colon polyps    Diabetes mellitus without complication (HCC)    Heart murmur    Hyperlipidemia    Hypertension      Past Surgical History:  Procedure Laterality Date   OTHER SURGICAL HISTORY  1992   Maxillary surgery after accident; rod placement in femur    Medications  Current Outpatient Medications on File Prior to Visit  Medication Sig Dispense Refill   amLODipine-valsartan (EXFORGE) 5-320 MG tablet TAKE 1 TABLET BY MOUTH EVERY DAY 90 tablet 1   aspirin EC 81 MG tablet Take 81 mg by mouth daily.     Blood Glucose Monitoring Suppl (ONETOUCH VERIO) w/Device KIT Check blood sugars once daily 1 kit 0   cholecalciferol (VITAMIN D) 400 units TABS tablet Take 400 Units by mouth daily.     co-enzyme Q-10 30 MG capsule Take 30 mg by mouth 3 (three) times daily.     ezetimibe (ZETIA) 10 MG tablet TAKE 1 TABLET BY MOUTH EVERY DAY 90 tablet 3   fenofibrate 160 MG tablet TAKE 1 TABLET BY MOUTH EVERY DAY 90 tablet 1   glucose blood (ONETOUCH VERIO) test strip Check blood sugars once daily 100 each 12   hydrochlorothiazide (HYDRODIURIL) 25 MG tablet  TAKE 1 TABLET BY MOUTH EVERY DAY 90 tablet 2   Lancets (ONETOUCH ULTRASOFT) lancets Check blood sugars once daily 100 each 12   Magnesium 250 MG TABS Take 1 tablet by mouth daily.     metFORMIN (GLUCOPHAGE-XR) 500 MG 24 hr tablet TAKE 2 TABLETS BY MOUTH TWICE DAILY. 360 tablet 1   simvastatin (ZOCOR) 40 MG tablet TAKE 1 TABLET BY MOUTH EVERYDAY AT BEDTIME 90 tablet 1   Allergies No Active Allergies  Family History Family History  Problem Relation Age of Onset   Arthritis Mother    Hearing loss Mother    Hyperlipidemia Mother    Hypertension Mother    Dementia Mother    Dementia Father    Muscular dystrophy Father    COPD Sister    Alcohol abuse Brother    COPD Maternal Grandmother    Hearing loss Maternal Grandmother    Diabetes Maternal Grandfather    Heart disease Paternal Grandfather    Heart attack Paternal Grandfather     Review of Systems: Constitutional:  no fevers Eye:  no recent significant change in vision Ears:  No changes in hearing Nose/Mouth/Throat:  no complaints of nasal congestion, no sore throat Cardiovascular: no chest pain Respiratory:  No shortness of breath Gastrointestinal:  No change in bowel habits GU:  No frequency Integumentary: As noted in HPI Neurologic:  no headaches  Endocrine:  denies unexplained weight changes  Exam BP 120/78 (BP Location: Left Arm, Patient Position: Sitting, Cuff Size: Normal)   Pulse 89   Temp 98.4 F (36.9 C) (Oral)   Ht 5\' 3"  (1.6 m)   Wt 155 lb (70.3 kg)   SpO2 94%   BMI 27.46 kg/m  General:  well developed, well nourished, in no apparent distress Skin: 0.6 cm circular lesion that is raised and flesh-colored with some scaling.  It is located on the dorsum of the right forearm.  Otherwise, no significant moles, warts, or growths Head:  no masses, lesions, or tenderness Eyes:  pupils equal and round, sclera anicteric without injection Ears:  canals without lesions, TMs shiny without retraction, no obvious  effusion, no erythema Nose:  nares patent, mucosa normal Throat/Pharynx:  lips and gingiva without lesion; tongue and uvula midline; non-inflamed pharynx; no exudates or postnasal drainage Lungs:  clear to auscultation, breath sounds equal bilaterally, no respiratory distress Cardio:  regular rate and rhythm, no LE edema or bruits Rectal: Deferred GI: BS+, S, NT, ND, no masses or organomegaly Musculoskeletal:  symmetrical muscle groups noted without atrophy or deformity Neuro:  gait normal; deep tendon reflexes normal and symmetric Psych: well oriented with normal range of affect and appropriate judgment/insight  Procedure note; shave biopsy; therapeutic and diagnostic Informed consent was obtained. The area was cleaned with alcohol and injected with 0.5 mL of 1% lidocaine with epinephrine. A Dermablade was slightly bent and used to cut under the area of interest. The specimen was placed in a sterile specimen cup and sent to the lab. The area was then cauterized ensuring adequate hemostasis. The area was dressed with triple antibiotic ointment and a bandage. There were no complications noted. The patient tolerated the procedure well.  Assessment and Plan  Well adult exam  Diabetes mellitus type 2 in nonobese (HCC) - Plan: CBC, Comprehensive metabolic panel, Lipid panel, Hemoglobin A1c, Microalbumin / creatinine urine ratio  Neoplasm of uncertain behavior - Plan: PR SHVG SKIN LESION 1 TRUNK/ARM/LEG DIAM 0.6-1.0 CM   Well 72 y.o. male. Counseled on diet and exercise. Other orders as above. Will come back for labs.  Skin lesion: Bx today. Aftercare instructions verbalized and written down. TAO bid. Tylenol, ice prn.  Follow up in 6 mo.  The patient voiced understanding and agreement to the plan.  [name] [place], DO 04/02/23 11:14 AM

## 2023-04-02 NOTE — Patient Instructions (Addendum)
Please come back for labs and be ready to give [place] a urine sample.   Try to join [place] with pool activities.   Do not shower for the rest of the day. When you do wash it, use only soap and water. Do not vigorously scrub. Apply triple antibiotic ointment (like Neosporin) twice daily. Keep the area clean and dry.   Things to look out for: increasing pain not relieved by ibuprofen/acetaminophen, fevers, spreading redness, drainage of pus, or foul odor.  Give [place] 1 week to get the results of your biopsy back.  Let us know if you need anything.

## 2023-04-02 NOTE — Addendum Note (Signed)
Addended by: Radene Gunning on: 04/02/2023 11:14 AM   Modules accepted: Orders

## 2023-04-03 ENCOUNTER — Other Ambulatory Visit (INDEPENDENT_AMBULATORY_CARE_PROVIDER_SITE_OTHER): Payer: PPO

## 2023-04-03 DIAGNOSIS — E119 Type 2 diabetes mellitus without complications: Secondary | ICD-10-CM | POA: Diagnosis not present

## 2023-04-03 LAB — COMPREHENSIVE METABOLIC PANEL WITH GFR
ALT: 21 U/L (ref 0–53)
AST: 19 U/L (ref 0–37)
Albumin: 4.6 g/dL (ref 3.5–5.2)
Alkaline Phosphatase: 27 U/L — ABNORMAL LOW (ref 39–117)
BUN: 27 mg/dL — ABNORMAL HIGH (ref 6–23)
CO2: 31 meq/L (ref 19–32)
Calcium: 10 mg/dL (ref 8.4–10.5)
Chloride: 103 meq/L (ref 96–112)
Creatinine, Ser: 1.06 mg/dL (ref 0.40–1.50)
GFR: 70.4 mL/min
Glucose, Bld: 127 mg/dL — ABNORMAL HIGH (ref 70–99)
Potassium: 4 meq/L (ref 3.5–5.1)
Sodium: 142 meq/L (ref 135–145)
Total Bilirubin: 0.7 mg/dL (ref 0.2–1.2)
Total Protein: 6.4 g/dL (ref 6.0–8.3)

## 2023-04-03 LAB — CBC
HCT: 43.5 % (ref 39.0–52.0)
Hemoglobin: 14.4 g/dL (ref 13.0–17.0)
MCHC: 33.2 g/dL (ref 30.0–36.0)
MCV: 89.6 fl (ref 78.0–100.0)
Platelets: 205 10*3/uL (ref 150.0–400.0)
RBC: 4.85 Mil/uL (ref 4.22–5.81)
RDW: 13.4 % (ref 11.5–15.5)
WBC: 6.5 10*3/uL (ref 4.0–10.5)

## 2023-04-03 LAB — MICROALBUMIN / CREATININE URINE RATIO
Creatinine,U: 86.7 mg/dL
Microalb Creat Ratio: 0.8 mg/g (ref 0.0–30.0)
Microalb, Ur: <0.7 mg/dL (ref 0.0–1.9)

## 2023-04-03 LAB — LIPID PANEL
Cholesterol: 90 mg/dL (ref 0–200)
HDL: 33.5 mg/dL — ABNORMAL LOW (ref >39.00)
LDL Cholesterol: 33 mg/dL (ref 0–99)
NonHDL: 56.94
Total CHOL/HDL Ratio: 3
Triglycerides: 121 mg/dL (ref 0.0–149.0)
VLDL: 24.2 mg/dL (ref 0.0–40.0)

## 2023-04-03 LAB — HEMOGLOBIN A1C: Hgb A1c MFr Bld: 6.4 % (ref 4.6–6.5)

## 2023-05-02 ENCOUNTER — Other Ambulatory Visit: Payer: Self-pay | Admitting: Family Medicine

## 2023-05-02 DIAGNOSIS — E119 Type 2 diabetes mellitus without complications: Secondary | ICD-10-CM

## 2023-06-05 ENCOUNTER — Other Ambulatory Visit: Payer: Self-pay | Admitting: Family Medicine

## 2023-06-13 ENCOUNTER — Other Ambulatory Visit: Payer: Self-pay | Admitting: Family Medicine

## 2023-06-26 DIAGNOSIS — H40023 Open angle with borderline findings, high risk, bilateral: Secondary | ICD-10-CM | POA: Diagnosis not present

## 2023-06-26 DIAGNOSIS — H2513 Age-related nuclear cataract, bilateral: Secondary | ICD-10-CM | POA: Diagnosis not present

## 2023-06-26 DIAGNOSIS — H0102A Squamous blepharitis right eye, upper and lower eyelids: Secondary | ICD-10-CM | POA: Diagnosis not present

## 2023-06-26 DIAGNOSIS — E119 Type 2 diabetes mellitus without complications: Secondary | ICD-10-CM | POA: Diagnosis not present

## 2023-06-26 LAB — HM DIABETES EYE EXAM

## 2023-08-16 ENCOUNTER — Other Ambulatory Visit: Payer: Self-pay | Admitting: Family Medicine

## 2023-08-21 ENCOUNTER — Other Ambulatory Visit: Payer: Self-pay | Admitting: Family Medicine

## 2023-08-21 DIAGNOSIS — I10 Essential (primary) hypertension: Secondary | ICD-10-CM

## 2023-09-07 ENCOUNTER — Other Ambulatory Visit: Payer: Self-pay | Admitting: Family Medicine

## 2023-09-07 DIAGNOSIS — I10 Essential (primary) hypertension: Secondary | ICD-10-CM

## 2023-10-07 ENCOUNTER — Encounter: Payer: Self-pay | Admitting: Family Medicine

## 2023-10-07 ENCOUNTER — Ambulatory Visit (INDEPENDENT_AMBULATORY_CARE_PROVIDER_SITE_OTHER): Payer: PPO | Admitting: Family Medicine

## 2023-10-07 VITALS — BP 122/76 | HR 88 | Temp 98.0°F | Resp 16 | Ht 63.0 in | Wt 152.2 lb

## 2023-10-07 DIAGNOSIS — Z7984 Long term (current) use of oral hypoglycemic drugs: Secondary | ICD-10-CM | POA: Diagnosis not present

## 2023-10-07 DIAGNOSIS — E781 Pure hyperglyceridemia: Secondary | ICD-10-CM

## 2023-10-07 DIAGNOSIS — E119 Type 2 diabetes mellitus without complications: Secondary | ICD-10-CM

## 2023-10-07 DIAGNOSIS — I10 Essential (primary) hypertension: Secondary | ICD-10-CM

## 2023-10-07 LAB — COMPREHENSIVE METABOLIC PANEL
ALT: 24 U/L (ref 0–53)
AST: 22 U/L (ref 0–37)
Albumin: 4.9 g/dL (ref 3.5–5.2)
Alkaline Phosphatase: 33 U/L — ABNORMAL LOW (ref 39–117)
BUN: 29 mg/dL — ABNORMAL HIGH (ref 6–23)
CO2: 32 meq/L (ref 19–32)
Calcium: 10.4 mg/dL (ref 8.4–10.5)
Chloride: 101 meq/L (ref 96–112)
Creatinine, Ser: 1.01 mg/dL (ref 0.40–1.50)
GFR: 74.34 mL/min (ref >60.00)
Glucose, Bld: 94 mg/dL (ref 70–99)
Potassium: 4 meq/L (ref 3.5–5.1)
Sodium: 142 meq/L (ref 135–145)
Total Bilirubin: 0.7 mg/dL (ref 0.2–1.2)
Total Protein: 6.7 g/dL (ref 6.0–8.3)

## 2023-10-07 LAB — LIPID PANEL
Cholesterol: 111 mg/dL (ref 0–200)
HDL: 44.4 mg/dL (ref >39.00)
LDL Cholesterol: 31 mg/dL (ref 0–99)
NonHDL: 66.32
Total CHOL/HDL Ratio: 2
Triglycerides: 179 mg/dL — ABNORMAL HIGH (ref 0.0–149.0)
VLDL: 35.8 mg/dL (ref 0.0–40.0)

## 2023-10-07 LAB — HEMOGLOBIN A1C: Hgb A1c MFr Bld: 6.7 % — ABNORMAL HIGH (ref 4.6–6.5)

## 2023-10-07 NOTE — Patient Instructions (Addendum)
 Give us  2-3 business days to get the results of your labs back.   Keep the diet clean and stay active.  Stay hydrated.   Let us  know if you need anything.  EXERCISES  RANGE OF MOTION (ROM) AND STRETCHING EXERCISES - Low Back Pain Most people with lower back pain will find that their symptoms get worse with excessive bending forward (flexion) or arching at the lower back (extension). The exercises that will help resolve your symptoms will focus on the opposite motion.  If you have pain, numbness or tingling which travels down into your buttocks, leg or foot, the goal of the therapy is for these symptoms to move closer to your back and eventually resolve. Sometimes, these leg symptoms will get better, but your lower back pain may worsen. This is often an indication of progress in your rehabilitation. Be very alert to any changes in your symptoms and the activities in which you participated in the 24 hours prior to the change. Sharing this information with your caregiver will allow him or her to most efficiently treat your condition. These exercises may help you when beginning to rehabilitate your injury. Your symptoms may resolve with or without further involvement from your physician, physical therapist or athletic trainer. While completing these exercises, remember:  Restoring tissue flexibility helps normal motion to return to the joints. This allows healthier, less painful movement and activity. An effective stretch should be held for at least 30 seconds. A stretch should never be painful. You should only feel a gentle lengthening or release in the stretched tissue. FLEXION RANGE OF MOTION AND STRETCHING EXERCISES:  STRETCH - Flexion, Single Knee to Chest  Lie on a firm bed or floor with both legs extended in front of you. Keeping one leg in contact with the floor, bring your opposite knee to your chest. Hold your leg in place by either grabbing behind your thigh or at your knee. Pull until  you feel a gentle stretch in your low back. Hold 30 seconds. Slowly release your grasp and repeat the exercise with the opposite side. Repeat 2 times. Complete this exercise 3 times per week.   STRETCH - Flexion, Double Knee to Chest Lie on a firm bed or floor with both legs extended in front of you. Keeping one leg in contact with the floor, bring your opposite knee to your chest. Tense your stomach muscles to support your back and then lift your other knee to your chest. Hold your legs in place by either grabbing behind your thighs or at your knees. Pull both knees toward your chest until you feel a gentle stretch in your low back. Hold 30 seconds. Tense your stomach muscles and slowly return one leg at a time to the floor. Repeat 2 times. Complete this exercise 3 times per week.   STRETCH - Low Trunk Rotation Lie on a firm bed or floor. Keeping your legs in front of you, bend your knees so they are both pointed toward the ceiling and your feet are flat on the floor. Extend your arms out to the side. This will stabilize your upper body by keeping your shoulders in contact with the floor. Gently and slowly drop both knees together to one side until you feel a gentle stretch in your low back. Hold for 30 seconds. Tense your stomach muscles to support your lower back as you bring your knees back to the starting position. Repeat the exercise to the other side. Repeat 2 times. Complete this  exercise at least 3 times per week.   EXTENSION RANGE OF MOTION AND FLEXIBILITY EXERCISES:  STRETCH - Extension, Prone on Elbows  Lie on your stomach on the floor, a bed will be too soft. Place your palms about shoulder width apart and at the height of your head. Place your elbows under your shoulders. If this is too painful, stack pillows under your chest. Allow your body to relax so that your hips drop lower and make contact more completely with the floor. Hold this position for 30 seconds. Slowly return  to lying flat on the floor. Repeat 2 times. Complete this exercise 3 times per week.   RANGE OF MOTION - Extension, Prone Press Ups Lie on your stomach on the floor, a bed will be too soft. Place your palms about shoulder width apart and at the height of your head. Keeping your back as relaxed as possible, slowly straighten your elbows while keeping your hips on the floor. You may adjust the placement of your hands to maximize your comfort. As you gain motion, your hands will come more underneath your shoulders. Hold this position 30 seconds. Slowly return to lying flat on the floor. Repeat 2 times. Complete this exercise 3 times per week.   RANGE OF MOTION- Quadruped, Neutral Spine  Assume a hands and knees position on a firm surface. Keep your hands under your shoulders and your knees under your hips. You may place padding under your knees for comfort. Drop your head and point your tailbone toward the ground below you. This will round out your lower back like an angry cat. Hold this position for 30 seconds. Slowly lift your head and release your tail bone so that your back sags into a large arch, like an old horse. Hold this position for 30 seconds. Repeat this until you feel limber in your low back. Now, find your sweet spot. This will be the most comfortable position somewhere between the two previous positions. This is your neutral spine. Once you have found this position, tense your stomach muscles to support your low back. Hold this position for 30 seconds. Repeat 2 times. Complete this exercise 3 times per week.   STRENGTHENING EXERCISES - Low Back Sprain These exercises may help you when beginning to rehabilitate your injury. These exercises should be done near your sweet spot. This is the neutral, low-back arch, somewhere between fully rounded and fully arched, that is your least painful position. When performed in this safe range of motion, these exercises can be used for people  who have either a flexion or extension based injury. These exercises may resolve your symptoms with or without further involvement from your physician, physical therapist or athletic trainer. While completing these exercises, remember:  Muscles can gain both the endurance and the strength needed for everyday activities through controlled exercises. Complete these exercises as instructed by your physician, physical therapist or athletic trainer. Increase the resistance and repetitions only as guided. You may experience muscle soreness or fatigue, but the pain or discomfort you are trying to eliminate should never worsen during these exercises. If this pain does worsen, stop and make certain you are following the directions exactly. If the pain is still present after adjustments, discontinue the exercise until you can discuss the trouble with your caregiver.  STRENGTHENING - Deep Abdominals, Pelvic Tilt  Lie on a firm bed or floor. Keeping your legs in front of you, bend your knees so they are both pointed toward the ceiling and  your feet are flat on the floor. Tense your lower abdominal muscles to press your low back into the floor. This motion will rotate your pelvis so that your tail bone is scooping upwards rather than pointing at your feet or into the floor. With a gentle tension and even breathing, hold this position for 3 seconds. Repeat 2 times. Complete this exercise 3 times per week.   STRENGTHENING - Abdominals, Crunches  Lie on a firm bed or floor. Keeping your legs in front of you, bend your knees so they are both pointed toward the ceiling and your feet are flat on the floor. Cross your arms over your chest. Slightly tip your chin down without bending your neck. Tense your abdominals and slowly lift your trunk high enough to just clear your shoulder blades. Lifting higher can put excessive stress on the lower back and does not further strengthen your abdominal muscles. Control your return  to the starting position. Repeat 2 times. Complete this exercise 3 times per week.   STRENGTHENING - Quadruped, Opposite UE/LE Lift  Assume a hands and knees position on a firm surface. Keep your hands under your shoulders and your knees under your hips. You may place padding under your knees for comfort. Find your neutral spine and gently tense your abdominal muscles so that you can maintain this position. Your shoulders and hips should form a rectangle that is parallel with the floor and is not twisted. Keeping your trunk steady, lift your right hand no higher than your shoulder and then your left leg no higher than your hip. Make sure you are not holding your breath. Hold this position for 30 seconds. Continuing to keep your abdominal muscles tense and your back steady, slowly return to your starting position. Repeat with the opposite arm and leg. Repeat 2 times. Complete this exercise 3 times per week.   STRENGTHENING - Abdominals and Quadriceps, Straight Leg Raise  Lie on a firm bed or floor with both legs extended in front of you. Keeping one leg in contact with the floor, bend the other knee so that your foot can rest flat on the floor. Find your neutral spine, and tense your abdominal muscles to maintain your spinal position throughout the exercise. Slowly lift your straight leg off the floor about 6 inches for a count of 3, making sure to not hold your breath. Still keeping your neutral spine, slowly lower your leg all the way to the floor. Repeat this exercise with each leg 2 times. Complete this exercise 3 times per week.  POSTURE AND BODY MECHANICS CONSIDERATIONS - Low Back Sprain Keeping correct posture when sitting, standing or completing your activities will reduce the stress put on different body tissues, allowing injured tissues a chance to heal and limiting painful experiences. The following are general guidelines for improved posture.  While reading these guidelines,  remember: The exercises prescribed by your provider will help you have the flexibility and strength to maintain correct postures. The correct posture provides the best environment for your joints to work. All of your joints have less wear and tear when properly supported by a spine with good posture. This means you will experience a healthier, less painful body. Correct posture must be practiced with all of your activities, especially prolonged sitting and standing. Correct posture is as important when doing repetitive low-stress activities (typing) as it is when doing a single heavy-load activity (lifting).  RESTING POSITIONS Consider which positions are most painful for you when choosing  a resting position. If you have pain with flexion-based activities (sitting, bending, stooping, squatting), choose a position that allows you to rest in a less flexed posture. You would want to avoid curling into a fetal position on your side. If your pain worsens with extension-based activities (prolonged standing, working overhead), avoid resting in an extended position such as sleeping on your stomach. Most people will find more comfort when they rest with their spine in a more neutral position, neither too rounded nor too arched. Lying on a non-sagging bed on your side with a pillow between your knees, or on your back with a pillow under your knees will often provide some relief. Keep in mind, being in any one position for a prolonged period of time, no matter how correct your posture, can still lead to stiffness.  PROPER SITTING POSTURE In order to minimize stress and discomfort on your spine, you must sit with correct posture. Sitting with good posture should be effortless for a healthy body. Returning to good posture is a gradual process. Many people can work toward this most comfortably by using various supports until they have the flexibility and strength to maintain this posture on their own. When sitting with  proper posture, your ears will fall over your shoulders and your shoulders will fall over your hips. You should use the back of the chair to support your upper back. Your lower back will be in a neutral position, just slightly arched. You may place a small pillow or folded towel at the base of your lower back for  support.  When working at a desk, create an environment that supports good, upright posture. Without extra support, muscles tire, which leads to excessive strain on joints and other tissues. Keep these recommendations in mind:  CHAIR: A chair should be able to slide under your desk when your back makes contact with the back of the chair. This allows you to work closely. The chair's height should allow your eyes to be level with the upper part of your monitor and your hands to be slightly lower than your elbows.  BODY POSITION Your feet should make contact with the floor. If this is not possible, use a foot rest. Keep your ears over your shoulders. This will reduce stress on your neck and low back.  INCORRECT SITTING POSTURES  If you are feeling tired and unable to assume a healthy sitting posture, do not slouch or slump. This puts excessive strain on your back tissues, causing more damage and pain. Healthier options include: Using more support, like a lumbar pillow. Switching tasks to something that requires you to be upright or walking. Talking a brief walk. Lying down to rest in a neutral-spine position.  PROLONGED STANDING WHILE SLIGHTLY LEANING FORWARD  When completing a task that requires you to lean forward while standing in one place for a long time, place either foot up on a stationary 2-4 inch high object to help maintain the best posture. When both feet are on the ground, the lower back tends to lose its slight inward curve. If this curve flattens (or becomes too large), then the back and your other joints will experience too much stress, tire more quickly, and can cause  pain.  CORRECT STANDING POSTURES Proper standing posture should be assumed with all daily activities, even if they only take a few moments, like when brushing your teeth. As in sitting, your ears should fall over your shoulders and your shoulders should fall over your hips.  You should keep a slight tension in your abdominal muscles to brace your spine. Your tailbone should point down to the ground, not behind your body, resulting in an over-extended swayback posture.   INCORRECT STANDING POSTURES  Common incorrect standing postures include a forward head, locked knees and/or an excessive swayback. WALKING Walk with an upright posture. Your ears, shoulders and hips should all line-up.  PROLONGED ACTIVITY IN A FLEXED POSITION When completing a task that requires you to bend forward at your waist or lean over a low surface, try to find a way to stabilize 3 out of 4 of your limbs. You can place a hand or elbow on your thigh or rest a knee on the surface you are reaching across. This will provide you more stability, so that your muscles do not tire as quickly. By keeping your knees relaxed, or slightly bent, you will also reduce stress across your lower back. CORRECT LIFTING TECHNIQUES  DO : Assume a wide stance. This will provide you more stability and the opportunity to get as close as possible to the object which you are lifting. Tense your abdominals to brace your spine. Bend at the knees and hips. Keeping your back locked in a neutral-spine position, lift using your leg muscles. Lift with your legs, keeping your back straight. Test the weight of unknown objects before attempting to lift them. Try to keep your elbows locked down at your sides in order get the best strength from your shoulders when carrying an object.   Always ask for help when lifting heavy or awkward objects. INCORRECT LIFTING TECHNIQUES DO NOT:  Lock your knees when lifting, even if it is a small object. Bend and twist.  Pivot at your feet or move your feet when needing to change directions. Assume that you can safely pick up even a paperclip without proper posture.

## 2023-10-07 NOTE — Progress Notes (Signed)
 Subjective:   Chief Complaint  Patient presents with   Follow-up    Follow up    Joshue Badal is a 73 y.o. male here for follow-up of diabetes.   Demarrion's self monitored glucose range is low 100's.  Patient denies hypoglycemic reactions. He checks his glucose levels 1 time(s) per week. Patient does not require insulin.   Medications include: Metformin  XR 1000 mg twice daily Diet is healthy.  Exercise: Walking, some weightlifting  Hypertension Patient presents for hypertension follow up. He does monitor home blood pressures. He is compliant with medications-hydrochlorothiazide  25 mg daily, Exforge  5-320 mg daily. Patient has these side effects of medication: none Diet/exercise as above.  Hyperlipidemia Patient presents for dyslipidemia follow up. Currently being treated with Zocor  40 mg daily, fenofibrate  160 mg daily, Zetia  10 mg daily and compliance with treatment thus far has been good. He denies myalgias. Diet/exercise as above. The patient is not known to have coexisting coronary artery disease.  Past Medical History:  Diagnosis Date   Colon polyps    Diabetes mellitus without complication (HCC)    Heart murmur    Hyperlipidemia    Hypertension      Related testing: Retinal exam: Done Pneumovax: done  Objective:  BP 122/76   Pulse 88   Temp 98 F (36.7 C) (Oral)   Resp 16   Ht 5' 3 (1.6 m)   Wt 152 lb 3.2 oz (69 kg)   SpO2 95%   BMI 26.96 kg/m  General:  Well developed, well nourished, in no apparent distress Lungs:  CTAB, no access msc use Cardio:  RRR, no bruits, no LE edema Psych: Age appropriate judgment and insight  Assessment:   Diabetes mellitus type 2 in nonobese (HCC) - Plan: Comprehensive metabolic panel, Lipid panel, Hemoglobin A1c  Essential hypertension  Hypertriglyceridemia   Plan:   Chronic, hopefully stable.  Continue metformin  XR 1000 mg twice daily.  Counseled on diet and exercise. Chronic, stable.  Continue Exforge   5-320 mg daily, hydrochlorothiazide  25 mg daily. Chronic, stable.  Continue Zocor  40 mg daily, Zetia  10 mg daily, fenofibrate  160 mg daily. F/u in 6 mo. The patient voiced understanding and agreement to the plan.  Mabel [place], DO 10/07/23 11:46 AM

## 2023-10-25 ENCOUNTER — Other Ambulatory Visit: Payer: Self-pay | Admitting: Family Medicine

## 2023-10-25 DIAGNOSIS — E119 Type 2 diabetes mellitus without complications: Secondary | ICD-10-CM

## 2023-10-26 ENCOUNTER — Other Ambulatory Visit: Payer: Self-pay | Admitting: Family Medicine

## 2023-11-02 ENCOUNTER — Other Ambulatory Visit: Payer: Self-pay | Admitting: Family Medicine

## 2023-12-06 ENCOUNTER — Other Ambulatory Visit: Payer: Self-pay | Admitting: Family Medicine

## 2023-12-25 DIAGNOSIS — H5203 Hypermetropia, bilateral: Secondary | ICD-10-CM | POA: Diagnosis not present

## 2023-12-25 DIAGNOSIS — H40023 Open angle with borderline findings, high risk, bilateral: Secondary | ICD-10-CM | POA: Diagnosis not present

## 2023-12-25 DIAGNOSIS — E119 Type 2 diabetes mellitus without complications: Secondary | ICD-10-CM | POA: Diagnosis not present

## 2023-12-25 DIAGNOSIS — H2513 Age-related nuclear cataract, bilateral: Secondary | ICD-10-CM | POA: Diagnosis not present

## 2023-12-25 DIAGNOSIS — H524 Presbyopia: Secondary | ICD-10-CM | POA: Diagnosis not present

## 2024-02-22 ENCOUNTER — Other Ambulatory Visit: Payer: Self-pay | Admitting: Family Medicine

## 2024-03-05 ENCOUNTER — Other Ambulatory Visit: Payer: Self-pay | Admitting: Family Medicine

## 2024-03-05 DIAGNOSIS — I10 Essential (primary) hypertension: Secondary | ICD-10-CM

## 2024-04-13 ENCOUNTER — Encounter: Payer: PPO | Admitting: Family Medicine

## 2024-04-15 ENCOUNTER — Ambulatory Visit: Admitting: Family Medicine

## 2024-04-15 ENCOUNTER — Ambulatory Visit: Payer: Self-pay | Admitting: Family Medicine

## 2024-04-15 ENCOUNTER — Encounter: Payer: Self-pay | Admitting: Family Medicine

## 2024-04-15 VITALS — BP 112/68 | HR 77 | Temp 98.0°F | Resp 16 | Ht 63.0 in | Wt 153.0 lb

## 2024-04-15 DIAGNOSIS — Z0000 Encounter for general adult medical examination without abnormal findings: Secondary | ICD-10-CM | POA: Diagnosis not present

## 2024-04-15 DIAGNOSIS — Z7984 Long term (current) use of oral hypoglycemic drugs: Secondary | ICD-10-CM | POA: Diagnosis not present

## 2024-04-15 DIAGNOSIS — E119 Type 2 diabetes mellitus without complications: Secondary | ICD-10-CM

## 2024-04-15 LAB — CBC
HCT: 46.4 % (ref 39.0–52.0)
Hemoglobin: 15.4 g/dL (ref 13.0–17.0)
MCHC: 33.2 g/dL (ref 30.0–36.0)
MCV: 88.8 fl (ref 78.0–100.0)
Platelets: 192 K/uL (ref 150.0–400.0)
RBC: 5.23 Mil/uL (ref 4.22–5.81)
RDW: 13.2 % (ref 11.5–15.5)
WBC: 7 K/uL (ref 4.0–10.5)

## 2024-04-15 LAB — COMPREHENSIVE METABOLIC PANEL WITH GFR
ALT: 24 U/L (ref 0–53)
AST: 26 U/L (ref 0–37)
Albumin: 4.9 g/dL (ref 3.5–5.2)
Alkaline Phosphatase: 35 U/L — ABNORMAL LOW (ref 39–117)
BUN: 28 mg/dL — ABNORMAL HIGH (ref 6–23)
CO2: 31 meq/L (ref 19–32)
Calcium: 9.7 mg/dL (ref 8.4–10.5)
Chloride: 101 meq/L (ref 96–112)
Creatinine, Ser: 1.18 mg/dL (ref 0.40–1.50)
GFR: 61.45 mL/min (ref >60.00)
Glucose, Bld: 122 mg/dL — ABNORMAL HIGH (ref 70–99)
Potassium: 4.2 meq/L (ref 3.5–5.1)
Sodium: 141 meq/L (ref 135–145)
Total Bilirubin: 0.6 mg/dL (ref 0.2–1.2)
Total Protein: 6.8 g/dL (ref 6.0–8.3)

## 2024-04-15 LAB — MICROALBUMIN / CREATININE URINE RATIO
Creatinine,U: 87.6 mg/dL
Microalb Creat Ratio: UNDETERMINED mg/g (ref 0.0–30.0)
Microalb, Ur: <0.7 mg/dL

## 2024-04-15 LAB — HEMOGLOBIN A1C: Hgb A1c MFr Bld: 6.7 % — ABNORMAL HIGH (ref 4.6–6.5)

## 2024-04-15 LAB — LIPID PANEL
Cholesterol: 105 mg/dL (ref 0–200)
HDL: 40.8 mg/dL (ref >39.00)
LDL Cholesterol: 45 mg/dL (ref 0–99)
NonHDL: 64.38
Total CHOL/HDL Ratio: 3
Triglycerides: 96 mg/dL (ref 0.0–149.0)
VLDL: 19.2 mg/dL (ref 0.0–40.0)

## 2024-04-15 NOTE — Patient Instructions (Signed)
 Give [place] 2-3 business days to get the results of your labs back.   Keep the diet clean and stay active.  Let us know if you need anything.

## 2024-04-15 NOTE — Progress Notes (Signed)
 Chief Complaint  Patient presents with   Annual Exam    CPE    Well Male Scott Davila is here for a complete physical.   His last physical was >1 year ago.  Current diet: in general, a healthy diet.   Current exercise: wt lifting, walking Weight trend: stable Fatigue out of ordinary? No. Seat belt? Yes.   Advanced directive? Yes  Health maintenance Shingrix- Yes Colonoscopy- Yes Tetanus- Yes Hep C- Yes Pneumonia vaccine- Yes AAA screening- Yes  Past Medical History:  Diagnosis Date   Colon polyps    Diabetes mellitus without complication (HCC)    Heart murmur    Hyperlipidemia    Hypertension      Past Surgical History:  Procedure Laterality Date   OTHER SURGICAL HISTORY  1992   Maxillary surgery after accident; rod placement in femur    Medications  Current Outpatient Medications on File Prior to Visit  Medication Sig Dispense Refill   amLODipine -valsartan  (EXFORGE ) 5-320 MG tablet TAKE 1 TABLET BY MOUTH EVERY DAY 90 tablet 1   aspirin EC 81 MG tablet Take 81 mg by mouth daily.     Blood Glucose Monitoring Suppl (ONETOUCH VERIO) w/Device KIT Check blood sugars once daily 1 kit 0   cholecalciferol (VITAMIN D) 400 units TABS tablet Take 400 Units by mouth daily.     co-enzyme Q-10 30 MG capsule Take 30 mg by mouth 3 (three) times daily.     ezetimibe  (ZETIA ) 10 MG tablet TAKE 1 TABLET BY MOUTH EVERY DAY 90 tablet 1   fenofibrate  160 MG tablet TAKE 1 TABLET BY MOUTH EVERY DAY 90 tablet 1   glucose blood (ONETOUCH VERIO) test strip Check blood sugars once daily 100 each 12   hydrochlorothiazide  (HYDRODIURIL ) 25 MG tablet TAKE 1 TABLET BY MOUTH EVERY DAY 90 tablet 2   Lancets (ONETOUCH DELICA PLUS LANCET33G) MISC USE TO CHECK BLOOD SUGARS ONCE DAILY 100 each 12   Magnesium 250 MG TABS Take 1 tablet by mouth daily.     metFORMIN  (GLUCOPHAGE -XR) 500 MG 24 hr tablet Take 2 tablets (1,000 mg total) by mouth 2 (two) times daily. 360 tablet 1   simvastatin  (ZOCOR ) 40  MG tablet TAKE 1 TABLET BY MOUTH EVERYDAY AT BEDTIME 90 tablet 1   Allergies No Active Allergies  Family History Family History  Problem Relation Age of Onset   Arthritis Mother    Hearing loss Mother    Hyperlipidemia Mother    Hypertension Mother    Dementia Mother    Dementia Father    Muscular dystrophy Father    COPD Sister    Alcohol abuse Brother    COPD Maternal Grandmother    Hearing loss Maternal Grandmother    Diabetes Maternal Grandfather    Heart disease Paternal Grandfather    Heart attack Paternal Grandfather     Review of Systems: Constitutional:  no fevers Eye:  no recent significant change in vision Ears:  No changes in hearing Nose/Mouth/Throat:  no complaints of nasal congestion, no sore throat Cardiovascular: no chest pain Respiratory:  No shortness of breath Gastrointestinal:  No change in bowel habits GU:  No frequency Integumentary:  no abnormal skin lesions reported Neurologic:  no headaches Endocrine:  denies unexplained weight changes  Exam BP 112/68 (BP Location: Left Arm, Patient Position: Sitting)   Pulse 77   Temp 98 F (36.7 C) (Oral)   Resp 16   Ht 5' 3 (1.6 m)   Wt 153 lb (69.4  kg)   SpO2 95%   BMI 27.10 kg/m  General:  well developed, well nourished, in no apparent distress Skin:  no significant moles, warts, or growths Head:  no masses, lesions, or tenderness Eyes:  pupils equal and round, sclera anicteric without injection Ears:  canals without lesions, TMs shiny without retraction, no obvious effusion, no erythema Nose:  nares patent, mucosa normal Throat/Pharynx:  lips and gingiva without lesion; tongue and uvula midline; non-inflamed pharynx; no exudates or postnasal drainage Lungs:  clear to auscultation, breath sounds equal bilaterally, no respiratory distress Cardio:  regular rate and rhythm, no LE edema or bruits Rectal: Deferred GI: BS+, S, NT, ND, no masses or organomegaly Musculoskeletal:  symmetrical muscle  groups noted without atrophy or deformity Neuro:  gait normal; deep tendon reflexes normal and symmetric Psych: well oriented with normal range of affect and appropriate judgment/insight  Assessment and Plan  Well adult exam  Diabetes mellitus type 2 in nonobese (HCC) - Plan: CBC, Comprehensive metabolic panel with GFR, Lipid panel, Hemoglobin A1c, Microalbumin / creatinine urine ratio   Well 73 y.o. male. Counseled on diet and exercise. Other orders as above. Follow up in in 6 mo.  The patient voiced understanding and agreement to the plan.  Scott Mt [place], DO 04/15/24 9:43 AM

## 2024-04-19 ENCOUNTER — Other Ambulatory Visit: Payer: Self-pay | Admitting: Family Medicine

## 2024-04-19 DIAGNOSIS — E119 Type 2 diabetes mellitus without complications: Secondary | ICD-10-CM

## 2024-05-15 ENCOUNTER — Other Ambulatory Visit: Payer: Self-pay | Admitting: Family Medicine

## 2024-05-15 DIAGNOSIS — I10 Essential (primary) hypertension: Secondary | ICD-10-CM

## 2024-05-29 ENCOUNTER — Encounter: Payer: Self-pay | Admitting: Family Medicine

## 2024-05-30 ENCOUNTER — Other Ambulatory Visit: Payer: Self-pay | Admitting: Family Medicine

## 2024-05-31 ENCOUNTER — Other Ambulatory Visit: Payer: Self-pay | Admitting: Family Medicine

## 2024-06-05 MED ORDER — COVID-19 MRNA VAC-TRIS(PFIZER) 30 MCG/0.3ML IM SUSY: 0.3000 mL | PREFILLED_SYRINGE | Freq: Once | INTRAMUSCULAR | 0 refills | Status: AC

## 2024-06-30 DIAGNOSIS — E119 Type 2 diabetes mellitus without complications: Secondary | ICD-10-CM | POA: Diagnosis not present

## 2024-06-30 DIAGNOSIS — H40023 Open angle with borderline findings, high risk, bilateral: Secondary | ICD-10-CM | POA: Diagnosis not present

## 2024-06-30 DIAGNOSIS — H2513 Age-related nuclear cataract, bilateral: Secondary | ICD-10-CM | POA: Diagnosis not present

## 2024-06-30 DIAGNOSIS — H0102A Squamous blepharitis right eye, upper and lower eyelids: Secondary | ICD-10-CM | POA: Diagnosis not present

## 2024-06-30 LAB — OPHTHALMOLOGY REPORT-SCANNED

## 2024-08-28 ENCOUNTER — Other Ambulatory Visit: Payer: Self-pay | Admitting: Family Medicine

## 2024-08-28 DIAGNOSIS — I10 Essential (primary) hypertension: Secondary | ICD-10-CM

## 2024-10-14 ENCOUNTER — Other Ambulatory Visit: Payer: Self-pay | Admitting: Family Medicine

## 2024-10-14 DIAGNOSIS — E119 Type 2 diabetes mellitus without complications: Secondary | ICD-10-CM

## 2024-10-16 ENCOUNTER — Encounter: Payer: Self-pay | Admitting: Family Medicine

## 2024-10-16 ENCOUNTER — Ambulatory Visit: Payer: Self-pay | Admitting: Family Medicine

## 2024-10-16 ENCOUNTER — Ambulatory Visit: Admitting: Family Medicine

## 2024-10-16 VITALS — BP 118/64 | HR 73 | Temp 97.9°F | Resp 16 | Ht 63.0 in | Wt 152.6 lb

## 2024-10-16 DIAGNOSIS — I10 Essential (primary) hypertension: Secondary | ICD-10-CM | POA: Diagnosis not present

## 2024-10-16 DIAGNOSIS — Z7984 Long term (current) use of oral hypoglycemic drugs: Secondary | ICD-10-CM

## 2024-10-16 DIAGNOSIS — E782 Mixed hyperlipidemia: Secondary | ICD-10-CM

## 2024-10-16 DIAGNOSIS — E119 Type 2 diabetes mellitus without complications: Secondary | ICD-10-CM | POA: Diagnosis not present

## 2024-10-16 LAB — COMPREHENSIVE METABOLIC PANEL WITH GFR
ALT: 17 U/L (ref 3–53)
AST: 24 U/L (ref 5–37)
Albumin: 4.7 g/dL (ref 3.5–5.2)
Alkaline Phosphatase: 34 U/L — ABNORMAL LOW (ref 39–117)
BUN: 24 mg/dL — ABNORMAL HIGH (ref 6–23)
CO2: 31 meq/L (ref 19–32)
Calcium: 9.9 mg/dL (ref 8.4–10.5)
Chloride: 101 meq/L (ref 96–112)
Creatinine, Ser: 0.97 mg/dL (ref 0.40–1.50)
GFR: 77.47 mL/min
Glucose, Bld: 130 mg/dL — ABNORMAL HIGH (ref 70–99)
Potassium: 4.2 meq/L (ref 3.5–5.1)
Sodium: 142 meq/L (ref 135–145)
Total Bilirubin: 0.8 mg/dL (ref 0.2–1.2)
Total Protein: 6.9 g/dL (ref 6.0–8.3)

## 2024-10-16 LAB — MICROALBUMIN / CREATININE URINE RATIO
Creatinine,U: 89.8 mg/dL
Microalb Creat Ratio: UNDETERMINED mg/g (ref 0.0–30.0)
Microalb, Ur: <0.7 mg/dL

## 2024-10-16 LAB — LIPID PANEL
Cholesterol: 103 mg/dL (ref 28–200)
HDL: 39.3 mg/dL
LDL Cholesterol: 42 mg/dL (ref 10–99)
NonHDL: 63.8
Total CHOL/HDL Ratio: 3
Triglycerides: 109 mg/dL (ref 10.0–149.0)
VLDL: 21.8 mg/dL (ref 0.0–40.0)

## 2024-10-16 LAB — HEMOGLOBIN A1C: Hgb A1c MFr Bld: 6.9 % — ABNORMAL HIGH (ref 4.6–6.5)

## 2024-10-16 NOTE — Patient Instructions (Signed)
 Give us  2-3 business days to get the results of your labs back.   Keep the diet clean and stay active.  Let us  know if you need anything.

## 2024-10-16 NOTE — Progress Notes (Signed)
 Subjective:   Chief Complaint  Patient presents with   Diabetes    Diabetes Check    Scott Davila is a 74 y.o. male here for follow-up of diabetes.   Khaleel's self monitored glucose range is low-mid 100's.  Patient denies hypoglycemic reactions. He checks his glucose levels once per week Patient does not require insulin.   Medications include: Metformin  XR 1000 mg twice daily Diet is healthy.  Exercise: strength training, walking  Hypertension Patient presents for hypertension follow up. He does not monitor home blood pressures. He is compliant with medications-Exforge  5-220 mg daily, hydrochlorothiazide  25 mg daily.. Patient has these side effects of medication: none Diet/exercise as above. No chest pain or shortness of breath.  Hyperlipidemia Patient presents for mixed hyperlipidemia follow up. Currently being treated with Zocor  40 mg daily, Zetia  10 mg daily, fenofibrate  160 mg daily and compliance with treatment thus far has been good. He denies myalgias. Diet/exercise as above. The patient is not known to have coexisting coronary artery disease.  Past Medical History:  Diagnosis Date   Colon polyps    Diabetes mellitus without complication (HCC)    Heart murmur    Hyperlipidemia    Hypertension      Related testing: Retinal exam: Done Pneumovax: done  Objective:  BP 118/64 (BP Location: Left Arm, Patient Position: Sitting)   Pulse 73   Temp 97.9 F (36.6 C) (Oral)   Resp 16   Ht 5' 3 (1.6 m)   Wt 152 lb 9.6 oz (69.2 kg)   SpO2 96%   BMI 27.03 kg/m  General:  Well developed, well nourished, in no apparent distress Lungs:  CTAB, no access msc use Cardio:  RRR, no bruits, no LE edema Psych: Age appropriate judgment and insight  Assessment:   Diabetes mellitus type 2 in nonobese (HCC) - Plan: Comprehensive metabolic panel with GFR, Lipid panel, Hemoglobin A1c, Microalbumin / creatinine urine ratio  Essential hypertension  Mixed hyperlipidemia    Plan:   Chronic, stable.  Continue metformin  XR 1000 mg twice daily counseled on diet and exercise. Chronic, stable.  Continue hydrochlorothiazide  25 mg daily, Exforge  5-320 mg daily. Chronic, stable.  Continue Zocor  40 mg daily, Zetia  10 mg daily, fenofibrate  160 mg daily. F/u in 6 mo. The patient voiced understanding and agreement to the plan.  Mabel [place], DO 10/16/24 8:54 AM

## 2025-04-20 ENCOUNTER — Encounter: Admitting: Family Medicine
